# Patient Record
Sex: Male | Born: 1965 | Marital: Single | Smoking: Never smoker
Health system: Southern US, Community
[De-identification: ages and names within clinical notes are randomized; demographics above are authoritative.]

## PROBLEM LIST (undated history)

## (undated) DIAGNOSIS — H409 Unspecified glaucoma: Secondary | ICD-10-CM

## (undated) DIAGNOSIS — N390 Urinary tract infection, site not specified: Secondary | ICD-10-CM

## (undated) DIAGNOSIS — S069X9A Unspecified intracranial injury with loss of consciousness of unspecified duration, initial encounter: Secondary | ICD-10-CM

## (undated) DIAGNOSIS — R569 Unspecified convulsions: Secondary | ICD-10-CM

## (undated) DIAGNOSIS — S069XAA Unspecified intracranial injury with loss of consciousness status unknown, initial encounter: Secondary | ICD-10-CM

## (undated) HISTORY — DX: Unspecified intracranial injury with loss of consciousness of unspecified duration, initial encounter: S06.9X9A

## (undated) HISTORY — DX: Unspecified intracranial injury with loss of consciousness status unknown, initial encounter: S06.9XAA

## (undated) HISTORY — DX: Unspecified glaucoma: H40.9

---

## 2005-06-12 ENCOUNTER — Encounter: Admission: RE | Admit: 2005-06-12 | Discharge: 2005-08-22 | Payer: Self-pay | Admitting: Psychiatry

## 2009-05-02 ENCOUNTER — Emergency Department: Payer: Self-pay | Admitting: Emergency Medicine

## 2009-05-08 ENCOUNTER — Emergency Department: Payer: Self-pay | Admitting: Emergency Medicine

## 2010-01-12 ENCOUNTER — Encounter: Payer: Self-pay | Admitting: Psychiatry

## 2010-02-02 ENCOUNTER — Encounter: Payer: Self-pay | Admitting: Psychiatry

## 2010-09-13 IMAGING — CT CT HEAD WITHOUT CONTRAST
2 series · 16 of 30 positions shown, 20 images · non-contrast
Comparison: none

REASON FOR EXAM: pt with hx of ZEINAB, Mentally handicapped, fall -
lac/contusion to head. Eval for
COMMENTS:

PROCEDURE:     CT  - CT HEAD WITHOUT CONTRAST  - May 02, 2009  [DATE]
RESULT:     Comparison:  None
TECHNIQUE: Multiple axial images from the foramen magnum to the vertex were
obtained without IV contrast.

[Series 2: without · axial · non-contrast · 0.45mm/px · z∈[+519,+679]mm · 13 of 38 slices shown, 17 images]
[im 3/38  brain]
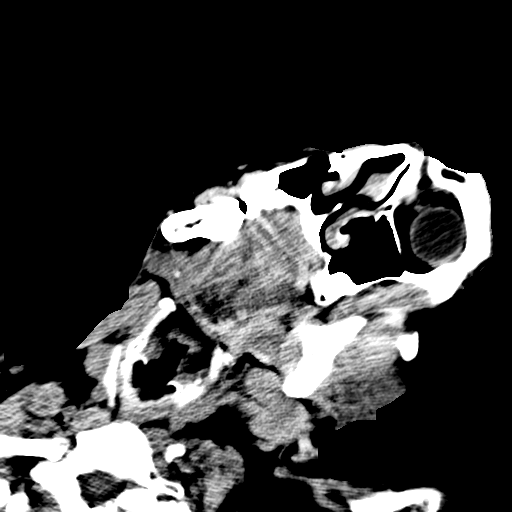
[im 3/38  bone]
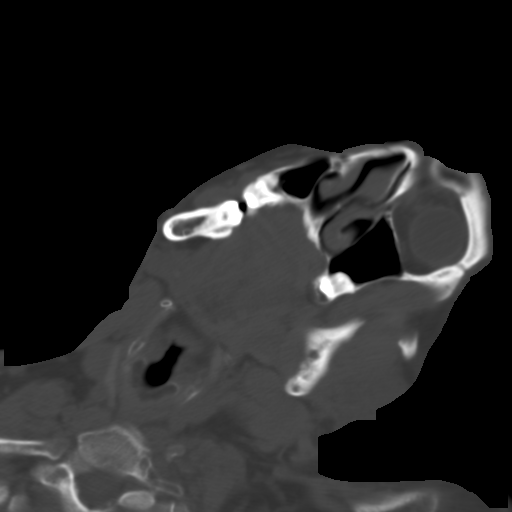
[im 6/38  brain]
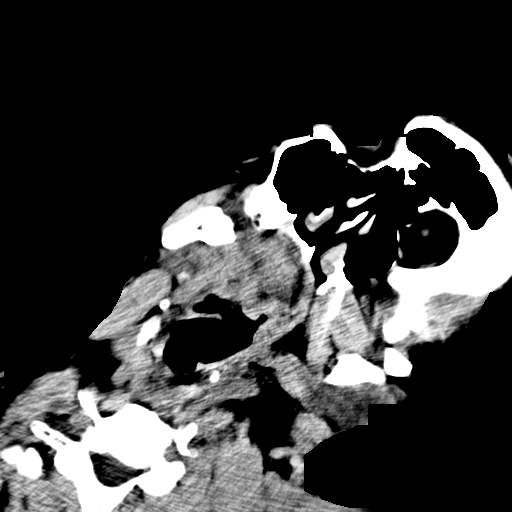
[im 8/38  brain]
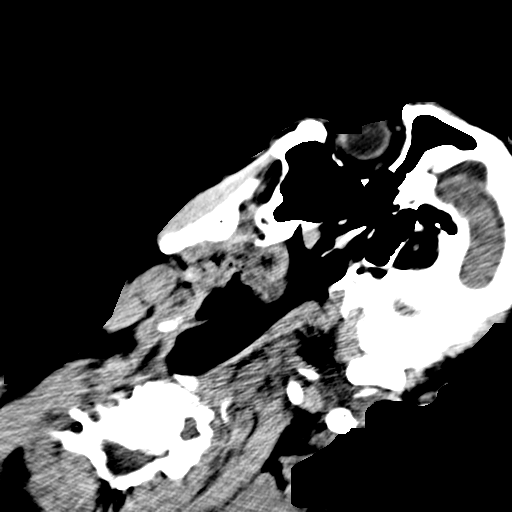
[im 11/38  brain]
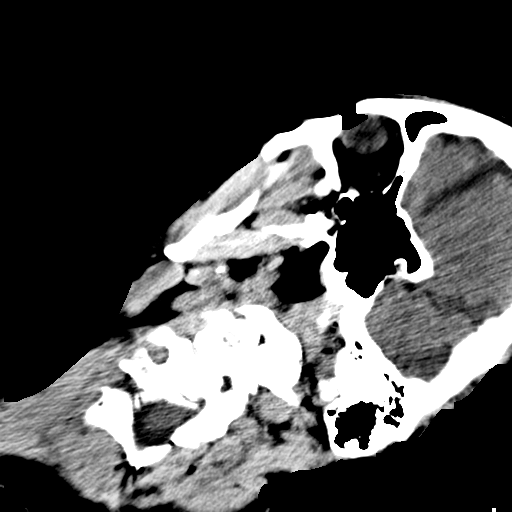
[im 14/38  brain]
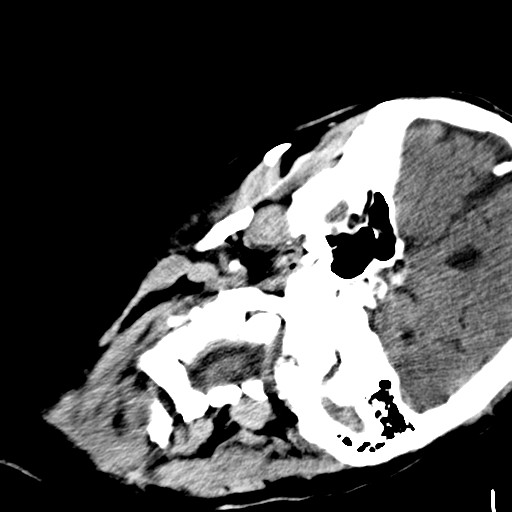
[im 14/38  bone]
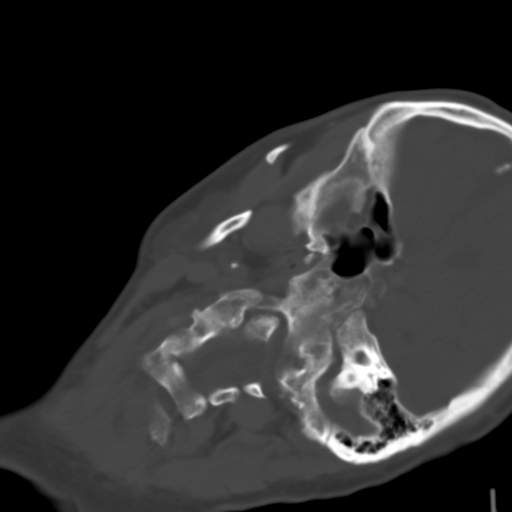
[im 16/38  brain]
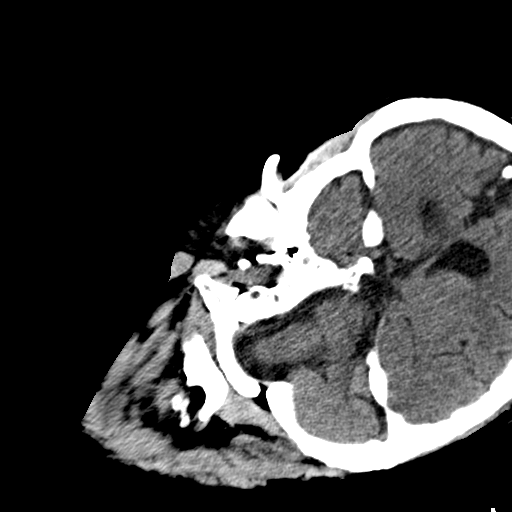
[im 19/38  brain]
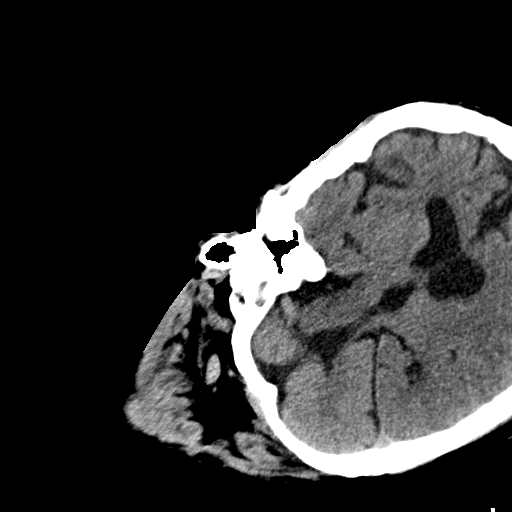
[im 22/38  brain]
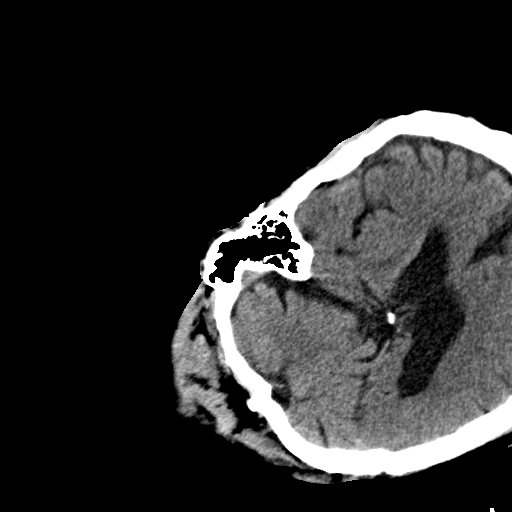
[im 24/38  brain]
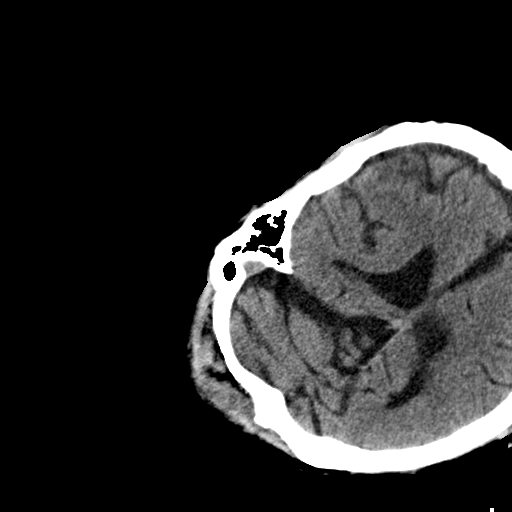
[im 24/38  bone]
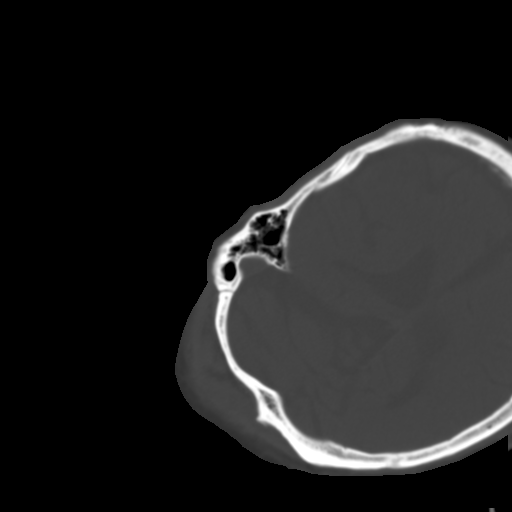
[im 27/38  brain]
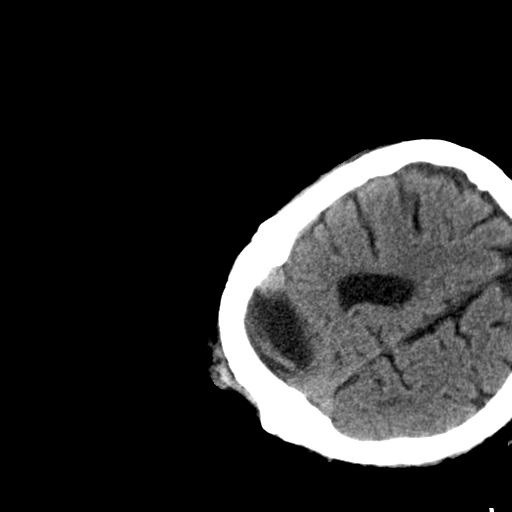
[im 30/38  brain]
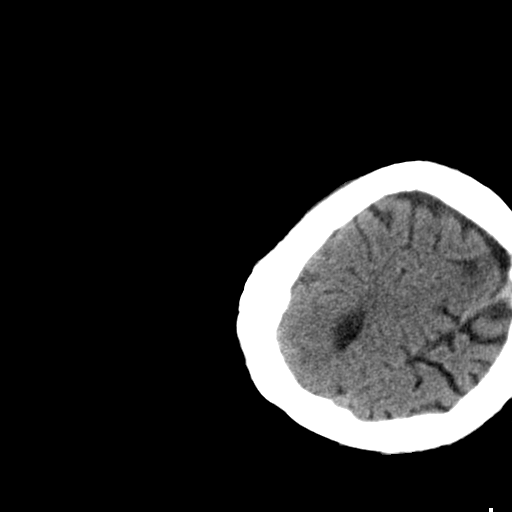
[im 32/38  brain]
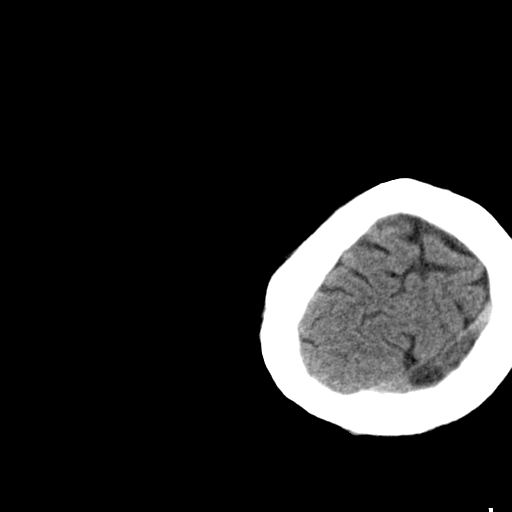
[im 35/38  brain]
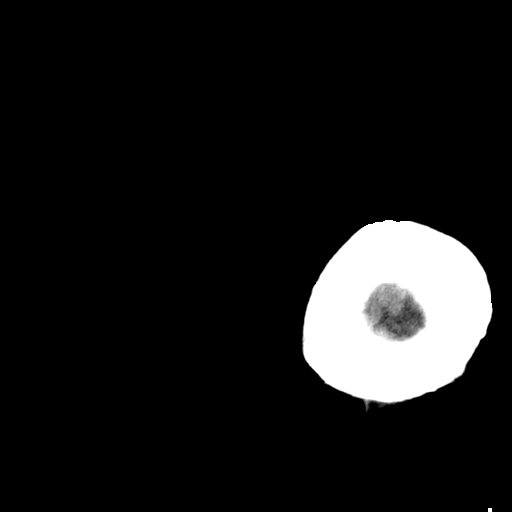
[im 35/38  bone]
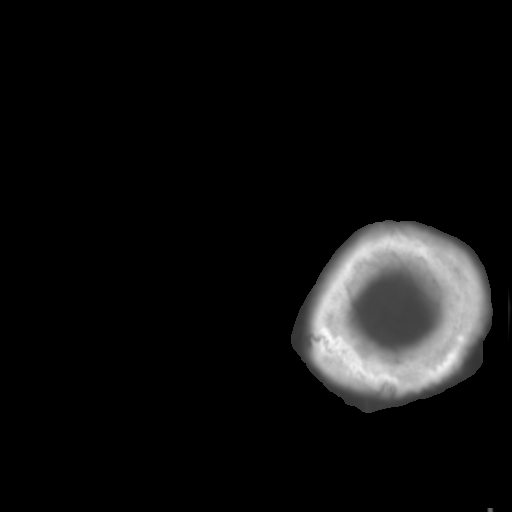

[Series 3: bone · axial · 0.45mm/px · z∈[+519,+574]mm · 3 of 38 slices shown]
[im 3/38  bone]
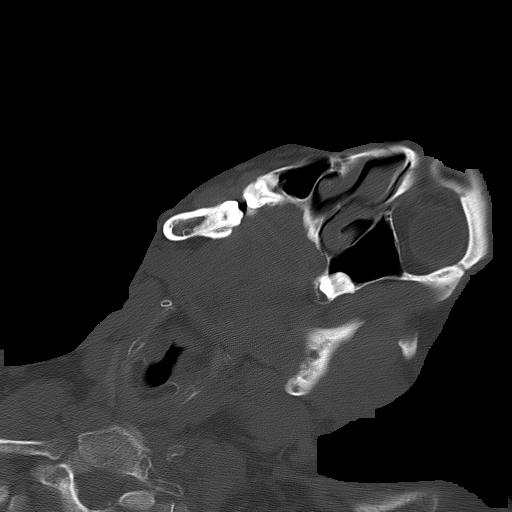
[im 8/38  bone]
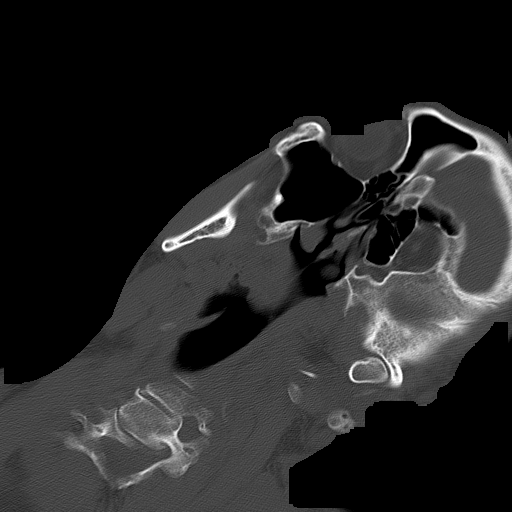
[im 14/38  bone]
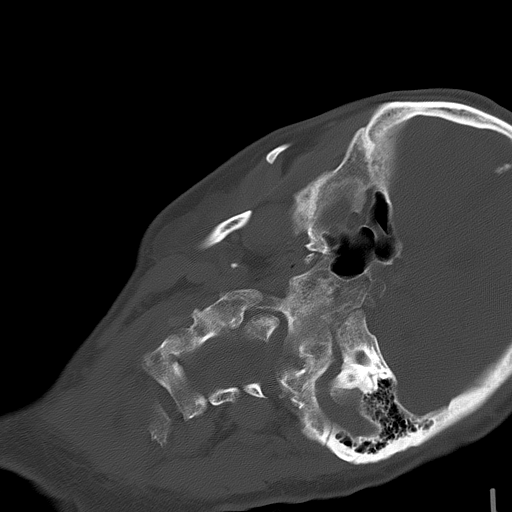

[16 of 30 positions shown; findings below may reference images not displayed]

FINDINGS: There is no evidence of mass effect, midline shift, or extra-axial fluid
collections.  There is no evidence of a space-occupying lesion or
intracranial hemorrhage. There is no evidence of a cortical-based area of
acute infarction. There is generalized cerebral atrophy.

The ventricles and sulci are appropriate for the patient's age. The basal
cisterns are patent.

Visualized portions of the orbits are unremarkable. The visualized portions
of the paranasal sinuses and mastoid air cells are unremarkable.

The osseous structures are unremarkable.
IMPRESSION: No acute intracranial process.

## 2013-11-19 ENCOUNTER — Encounter: Payer: Self-pay | Admitting: Neurology

## 2013-12-03 ENCOUNTER — Encounter: Payer: Self-pay | Admitting: Neurology

## 2014-01-03 ENCOUNTER — Encounter: Payer: Self-pay | Admitting: Neurology

## 2014-02-02 ENCOUNTER — Encounter: Payer: Self-pay | Admitting: Neurology

## 2014-05-27 ENCOUNTER — Encounter
Admit: 2014-05-27 | Disposition: A | Discharge status: Home / Self Care | Payer: Self-pay | Attending: Neurology | Admitting: Neurology

## 2014-06-04 ENCOUNTER — Encounter
Admit: 2014-06-04 | Disposition: A | Discharge status: Home / Self Care | Payer: Self-pay | Attending: Neurology | Admitting: Neurology

## 2014-07-08 ENCOUNTER — Ambulatory Visit: Payer: Medicare Other | Attending: Neurology | Admitting: Physical Therapy

## 2014-07-08 ENCOUNTER — Encounter: Payer: Self-pay | Admitting: Physical Therapy

## 2014-07-08 ENCOUNTER — Ambulatory Visit: Payer: Self-pay | Admitting: Physical Therapy

## 2014-07-08 DIAGNOSIS — M6281 Muscle weakness (generalized): Secondary | ICD-10-CM | POA: Insufficient documentation

## 2014-07-08 DIAGNOSIS — G243 Spasmodic torticollis: Secondary | ICD-10-CM | POA: Diagnosis present

## 2014-07-08 DIAGNOSIS — M2560 Stiffness of unspecified joint, not elsewhere classified: Secondary | ICD-10-CM | POA: Diagnosis not present

## 2014-07-08 DIAGNOSIS — R531 Weakness: Secondary | ICD-10-CM | POA: Diagnosis not present

## 2014-07-08 DIAGNOSIS — M5382 Other specified dorsopathies, cervical region: Secondary | ICD-10-CM | POA: Insufficient documentation

## 2014-07-08 DIAGNOSIS — M436 Torticollis: Secondary | ICD-10-CM

## 2014-07-08 NOTE — Therapy (Signed)
[facility] [address] [place], [place], [address] Phone: [phone]   Fax:  [phone]  Physical Therapy Treatment  Patient Details  Name: Jacob HammanSteven G Tangen MRN: [id number] Date of Birth: 08/22/1965 Referring Provider:  Morene CrockerPotter, Zachary E, MD  Encounter Date: 07/08/2014      PT End of Session - 07/08/14 1000    Visit Number 5   Number of Visits 10   Date for PT Re-Evaluation 07/22/14   Authorization Type 5   Authorization Time Period 10   PT Start Time 0900   PT Stop Time 0945   PT Time Calculation (min) 45 min   Activity Tolerance Patient limited by fatigue      Past Medical History  Diagnosis Date  . Closed TBI (traumatic brain injury) approximately 40 yrs ago    No past surgical history on file.  There were no vitals filed for this visit.  Visit Diagnosis:  Stiffness of cervical spine  Muscle weakness      Subjective Assessment - 07/08/14 0955    Subjective Pt and mother present for session. Pt's mother reports he has been unable to participate with stretching routine, has been "fighting" against stretching.   Patient is accompained by: Family member   Currently in Pain? Other (Comment)  unable to assess            [facility] PT Assessment - 07/08/14 0001    Assessment   Medical Diagnosis weakness, cervical dystonia   Balance Screen   Has the patient fallen in the past 6 months No   Has the patient had a decrease in activity level because of a fear of falling?  No   Is the patient reluctant to leave their home because of a fear of falling?  No         Objective:  Manual cervical stretching performed in seated, R lateral flexion, L rotation, performed separately and as a joint movement. Pt required extensive cuing from PT and mother to participate with manual therapy.   Extensive exercise attempted with pt's mother, PT and pt using candy as assist for improving posture, resisting manual  pressure for cervical motion, attempting to achieve neutral spinal posture. Pt had difficulty with this, required manual and verbal cuing.                     PT Education - 07/08/14 [phone]    Education provided Yes   Education Details educated pt's mother on expectations regarding OT visit   Person(s) Educated Parent(s);Patient   Methods Explanation             PT Long Term Goals - 07/08/14 1004    PT LONG TERM GOAL #1   Title Pt will be able to partcipate with cervical strengthening isometrics with less than 3 verbal cues to improve cervical spine strength   Time 8   Period Weeks   Status On-going   PT LONG TERM GOAL #2   Title Pt will improve cervical lateral flexion to 15 degrees R lateral flexion indicating improvement in motion to decr. strain on caregivers when assisting with feeding and bathing tasks.   Time 8   Period Weeks   Status On-going   PT LONG TERM GOAL #3   Title Pt will allow PT to perform scapular manual tx to decr. scapular IR and allow for lengthening of cervicothoracic musculature to improve respiration.   Time 8   Period Weeks  Status On-going               Plan - 07/08/14 1002    Clinical Impression Statement Pt demonstrated decr. tolerance for PT today, he has not been able to participate with his mother with his HEP. Noted incr. tone in B cervical musculature, significantly impaired posture. Pt is having incr. difficulty with ADLs/eating/participating in hygiene activity.   Pt will benefit from skilled therapeutic intervention in order to improve on the following deficits Improper body mechanics;Postural dysfunction;Decreased strength   Rehab Potential Fair   PT Frequency 2x / week   PT Duration 6 weeks   PT Treatment/Interventions Manual techniques;Wheelchair mobility training;Therapeutic activities;ADLs/Self Care Home Management   PT Next Visit Plan Reassess following OT assessment.   Consulted and Agree with Plan of Care  Patient;Family member/caregiver        Problem List There are no active problems to display for this patient.   Fisher,Benjamin PT, DPT 07/08/2014, 10:08 AM  [place] [facility] SPORTS MEDICINE [address] [place], [place], [address] Phone: [phone]   Fax:  [phone]

## 2014-07-14 ENCOUNTER — Ambulatory Visit: Payer: Medicare Other | Admitting: Physical Therapy

## 2014-07-14 DIAGNOSIS — M436 Torticollis: Secondary | ICD-10-CM

## 2014-07-14 DIAGNOSIS — R531 Weakness: Secondary | ICD-10-CM | POA: Diagnosis not present

## 2014-07-14 NOTE — Therapy (Signed)
[facility] [address] [place], [place], [address] Phone: [phone]   Fax:  [phone]  Physical Therapy Treatment  Patient Details  Name: Jacob Green MRN: [id number] Date of Birth: 01/12/1966 Referring Provider:  Morene CrockerPotter, Zachary E, MD  Encounter Date: 07/14/2014      PT End of Session - 07/14/14 0913    Visit Number 6   Number of Visits 10   Date for PT Re-Evaluation 07/22/14   Authorization Type 6   Authorization Time Period 10   PT Start Time 0910   PT Stop Time 0950   PT Time Calculation (min) 40 min   Activity Tolerance Patient tolerated treatment well      Past Medical History  Diagnosis Date  . Closed TBI (traumatic brain injury) approximately 40 yrs ago    No past surgical history on file.  There were no vitals filed for this visit.  Visit Diagnosis:  Stiffness of cervical spine      Subjective Assessment - 07/14/14 0911    Subjective Pt and mother present for session. She reports she has been inconsistent with helping pt with stretching and exercise, however she feels he has been doing better overall with upright posture.   Patient is accompained by: Family member             Extensive manual t stretching cervical spine, CPAs grade III L UPAs 3x1 min seated 3x1 min from T2-T6.  L cervical lateral flexion with closing mobilization performed x8 min.  Reaching for ball to facilitate trunk control/strengthen abs x5 min.  Cuing for pt to perform resisted cervical rotation.                    PT Education - 07/14/14 0913    Education Details Educated pt's mother on hand placement when stetching to avoid pain in wrist.   Person(s) Educated Patient;Parent(s)   Methods Explanation             PT Long Term Goals - 07/08/14 1004    PT LONG TERM GOAL #1   Title Pt will be able to partcipate with cervical strengthening isometrics with less than 3 verbal cues  to improve cervical spine strength   Time 8   Period Weeks   Status On-going   PT LONG TERM GOAL #2   Title Pt will improve cervical lateral flexion to 15 degrees R lateral flexion indicating improvement in motion to decr. strain on caregivers when assisting with feeding and bathing tasks.   Time 8   Period Weeks   Status On-going   PT LONG TERM GOAL #3   Title Pt will allow PT to perform scapular manual tx to decr. scapular IR and allow for lengthening of cervicothoracic musculature to improve respiration.   Time 8   Period Weeks   Status On-going               Plan - 07/14/14 [id number]    Clinical Impression Statement Significant improvement in upright posture, acceptance of verbal cues, and tolerance for both manual tx and participation with exercise today. Pt will not be seen next week but will have OT assessment, to be followed up the following week.   Pt will benefit from skilled therapeutic intervention in order to improve on the following deficits Improper body mechanics;Postural dysfunction;Decreased strength   Rehab Potential Fair   PT Treatment/Interventions Manual techniques;Wheelchair mobility training;Therapeutic activities;ADLs/Self Care Home Management   PT  Next Visit Plan Pt has not yet been seen by OT, will be placed on hold pending visit to OT.        Problem List There are no active problems to display for this patient.   Geno Sydnor 07/14/2014, 10:00 AM  [facility] [facility] PH[facility]ICAL [facility] SP[facility]TS [facility]ICINE [address] [place], [place], [address] Phone: [phone]   Fax:  [phone]

## 2014-07-15 ENCOUNTER — Encounter: Payer: Self-pay | Admitting: Physical Therapy

## 2014-07-19 ENCOUNTER — Encounter: Payer: Self-pay | Admitting: Occupational Therapy

## 2014-07-19 ENCOUNTER — Ambulatory Visit: Payer: Medicare Other | Admitting: Occupational Therapy

## 2014-07-19 DIAGNOSIS — R531 Weakness: Secondary | ICD-10-CM | POA: Diagnosis not present

## 2014-07-19 DIAGNOSIS — M6281 Muscle weakness (generalized): Secondary | ICD-10-CM
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
## 2014-07-21 ENCOUNTER — Encounter: Payer: Medicare Other | Admitting: Physical Therapy

## 2014-07-26 ENCOUNTER — Encounter: Payer: Self-pay | Admitting: Occupational Therapy

## 2014-07-26 ENCOUNTER — Encounter: Payer: Medicare Other | Admitting: Occupational Therapy

## 2014-07-26 ENCOUNTER — Ambulatory Visit: Payer: Medicare Other | Admitting: Occupational Therapy

## 2014-07-26 DIAGNOSIS — R531 Weakness: Secondary | ICD-10-CM | POA: Diagnosis not present

## 2014-07-26 DIAGNOSIS — M6281 Muscle weakness (generalized): Secondary | ICD-10-CM

## 2014-07-26 NOTE — Therapy (Signed)
[place] [facility] MAIN [facility] SERVICES [address] [place], [place], [address] Phone: [phone]   Fax:  [phone]  Occupational Therapy Treatment  Patient Details  Name: Jacob Green MRN: [id number] Date of Birth: [birth year] Referring Provider:  Morene Crocker, MD  Encounter Date: 07/26/2014      OT End of Session - 07/26/14 1022    Visit Number 2   Number of Visits 16   OT Start Time 0915   OT Stop Time 1010   OT Time Calculation (min) 55 min   Equipment Utilized During Treatment Adapted spoon and positioning for wheel chair   Activity Tolerance Patient tolerated treatment well;Treatment limited secondary to agitation      Past Medical History  Diagnosis Date  . Closed TBI (traumatic brain injury) approximately 40 yrs ago  . Glaucoma     No past surgical history on file.  There were no vitals filed for this visit.  Visit Diagnosis:  Muscle weakness      Subjective Assessment - 07/26/14 1017    Subjective  Patient and mom agreed they thought the curved spoon will work.   Patient is accompained by: Family member   Currently in Pain? No/denies    Practiced with a curved spoon curved toward his mouth using his more functional left hand and was successful in bringing food to his mouth. Cues needed for proper technique. Issued spoon to mom to try and given written instructions on how to obtain one if it works at home. Completed wheel chair adjustment and positioning to be more contusive to self feeding with out spillage or dripping from mouth.  Slow stretch given to right elbow and hand so it will not have further contractures and not be in the way for self feeding.                          OT Education - 07/26/14 1019    Education provided Yes   Education Details Instructed in wheel chair positioning to be more condusive to self feeding. Also alternative spoon which is curved tward his mouth. Issued spoon to try  and information regarding how to purchase one.   Person(s) Educated Patient;Parent(s)   Methods Explanation;Demonstration   Comprehension Verbalized understanding;Returned demonstration;Verbal cues required;Tactile cues required;Need further instruction          OT Short Term Goals - 07/19/14 0951    OT SHORT TERM GOAL #1   Title Will be able to eat with minimal spillage using adapted devices.           OT Long Term Goals - 07/19/14 1111    OT LONG TERM GOAL #1   Title Will be able to eat with minimal spillage using adapted devices.   Baseline Recently started dropping food and food falling from his mouth and forming a sore on left lateral lip.   Time 12   Period Weeks   Status New   OT LONG TERM GOAL #2   Title Will be able to sit up straight with head erect during self feeding.   Baseline leans severly to the left at neck and moderately in trunk.   Time 12   Period Weeks   Status New               Plan - 07/26/14 1030    Pt will benefit from skilled therapeutic intervention in order to improve on the following deficits (Retired) Decreased balance;Decreased  cognition;Decreased coordination;Decreased endurance;Decreased mobility;Decreased range of motion;Increased muscle spasms;Decreased strength;Impaired tone;Impaired UE functional use;Impaired vision/preception   Rehab Potential Good   OT Frequency 2x / week   OT Duration 12 weeks   OT Treatment/Interventions Visual/perceptual remediation/compensation;Neuromuscular education;Self-care/ADL training;Other (comment)   Plan Continuing adapted devices and positioning and stretching.   Consulted and Agree with Plan of Care Patient;Other (Comment)  mother        Problem List There are no active problems to display for this patient.  Ocie CornfieldJohn M Malaiyah Achorn, MS/OTR/L Ocie CornfieldHuff, Jden Want M 07/26/2014, 10:43 AM  [place] [facility] [address] [place], [place], [address] Phone:  [phone]   Fax:  [phone]

## 2014-07-28 ENCOUNTER — Ambulatory Visit: Payer: Medicare Other | Admitting: Physical Therapy

## 2014-07-28 DIAGNOSIS — M436 Torticollis: Secondary | ICD-10-CM

## 2014-07-28 DIAGNOSIS — R531 Weakness: Secondary | ICD-10-CM | POA: Diagnosis not present

## 2014-07-28 NOTE — Therapy (Signed)
[facility] PHYSICAL [facility] SPORTS MEDICINE [address] [place], [place], [address] Phone: [phone]   Fax:  [phone]  Physical Therapy Treatment  Patient Details  Name: Jacob Green MRN: [id number] Date of Birth: 11/24/1965 Referring Provider:  Morene CrockerPotter, Zachary E, MD  Encounter Date: 07/28/2014      PT End of Session - 07/28/14 0925    Visit Number 7   Number of Visits 10   Date for PT Re-Evaluation 07/22/14   Authorization Type 7   Authorization Time Period 10   PT Start Time 0900   PT Stop Time 0940   PT Time Calculation (min) 40 min   Activity Tolerance Patient tolerated treatment well   Behavior During Therapy Restless      Past Medical History  Diagnosis Date  . Closed TBI (traumatic brain injury) approximately 40 yrs ago  . Glaucoma     No past surgical history on file.  There were no vitals filed for this visit.  Visit Diagnosis:  Stiffness of cervical spine      Subjective Assessment - 07/28/14 0907    Subjective Pt and mother present for session. She reports he has had two instances of seeing an OT with little to no effect on eating difficulty. Reports he has had notable drop in tolerance for stretching.   Patient is accompained by: Family member   Currently in Pain? No/denies               Objective: Seated scapular mobs inferior, posterior tilt, medial 3x30.  CPAs grade III 3x1 min C7-T4.  L UPAs grade III 31 min same regions.  Sustained cervical lateral flexion stretch 3x4 min.  Pt became agitated during session with sustained lateral flexion stretch once he achieved 15 degr. Had difficulty tolerating additional PT despite extensive cuing from PT and from mother.  Added towel to R side w/c to improve upright posture.  Following session pt had  Notable incr. In upright posture, ear no longer resting on shoulder when exiting session.                       PT Long Term Goals -  07/28/14 0933    PT LONG TERM GOAL #1   Title Pt will be able to partcipate with cervical strengthening isometrics with less than 3 verbal cues to improve cervical spine strength   Time 8   Period Weeks   Status On-going   PT LONG TERM GOAL #2   Title Pt will improve cervical lateral flexion to 15 degrees R lateral flexion indicating improvement in motion to decr. strain on caregivers when assisting with feeding and bathing tasks.   Time 8   Status Achieved   PT LONG TERM GOAL #3   Title Pt will allow PT to perform scapular manual tx to decr. scapular IR and allow for lengthening of cervicothoracic musculature to improve respiration.   Time 8   Period Weeks   Status On-going   PT LONG TERM GOAL #4   Title Pt's mother will report no difficulty with shaving pt due to impaired posture.   Time 8   Period Weeks   Status New               Problem List There are no active problems to display for this patient.   [name] 07/28/2014, 9:47 AM  [place] [facility] SP[facility] MEDICINE [address] [place], [place],  [place] Phone: [phone]   Fax:  [phone]

## 2014-08-03 ENCOUNTER — Ambulatory Visit: Payer: Medicare Other | Admitting: Occupational Therapy

## 2014-08-03 DIAGNOSIS — M436 Torticollis: Secondary | ICD-10-CM

## 2014-08-03 DIAGNOSIS — R531 Weakness: Secondary | ICD-10-CM | POA: Diagnosis not present

## 2014-08-03 DIAGNOSIS — M6281 Muscle weakness (generalized): Secondary | ICD-10-CM

## 2014-08-03 NOTE — Therapy (Signed)
[place] [facility] [facility] [address] [place], [place], [address] Phone: [phone]   Fax:  [phone]  Occupational Therapy Treatment  Patient Details  Name: Lavina HammanSteven G Heffernan MRN: [id number] Date of Birth: 05/25/1965 Referring Provider:  Morene CrockerPotter, Zachary E, MD  Encounter Date: 08/03/2014    Past Medical History  Diagnosis Date  . Closed TBI (traumatic brain injury) approximately 40 yrs ago  . Glaucoma     No past surgical history on file.  There were no vitals filed for this visit.  Visit Diagnosis:  Muscle weakness  Stiffness of cervical spine Experimented with different plates and adapted spoons. Sent home with a lipped plate and different handle to spoon as other did not work. He will try this week and given written information to order if it works. Stretching completed to right upper extremity to aid in positioning.                              OT Short Term Goals - 07/19/14 0951    OT SHORT TERM GOAL #1   Title Will be able to eat with minimal spillage using adapted devices.           OT Long Term Goals - 07/19/14 1111    OT LONG TERM GOAL #1   Title Will be able to eat with minimal spillage using adapted devices.   Baseline Recently started dropping food and food falling from his mouth and forming a sore on left lateral lip.   Time 12   Period Weeks   Status New   OT LONG TERM GOAL #2   Title Will be able to sit up straight with head erect during self feeding.   Baseline leans severly to the left at neck and moderately in trunk.   Time 12   Period Weeks   Status New               Problem List There are no active problems to display for this patient. Ocie CornfieldJohn M Harleigh Civello, MS/OTR/L  Ocie CornfieldHuff, Egbert Seidel M 08/03/2014, 9:50 AM  [facility] [address] [place], [place], [address] Phone: [phone]   Fax:  [phone]

## 2014-08-05 ENCOUNTER — Encounter: Payer: Medicare Other | Admitting: Physical Therapy

## 2014-08-09 ENCOUNTER — Encounter: Payer: Medicare Other | Admitting: Occupational Therapy

## 2014-08-11 ENCOUNTER — Ambulatory Visit: Payer: Medicare Other | Attending: Neurology | Admitting: Physical Therapy

## 2014-08-11 DIAGNOSIS — M436 Torticollis: Secondary | ICD-10-CM | POA: Insufficient documentation

## 2014-08-11 DIAGNOSIS — M6281 Muscle weakness (generalized): Secondary | ICD-10-CM | POA: Diagnosis present

## 2014-08-11 NOTE — Therapy (Signed)
[facility] [address], [place], [address] Phone: [phone]   Fax:  [phone]  Physical Therapy Treatment  Patient Details  Name: Jacob Green MRN: [id number] Date of Birth: [birth year] Referring Provider:  Morene Crocker, MD  Encounter Date: 08/11/2014      PT End of Session - 08/11/14 0944    Visit Number 8   Number of Visits 10   Date for PT Re-Evaluation 09/03/14   Authorization Type 8   Authorization Time Period 10   PT Start Time 0900   PT Stop Time 0940   PT Time Calculation (min) 40 min   Activity Tolerance Patient tolerated treatment well      Past Medical History  Diagnosis Date  . Closed TBI (traumatic brain injury) approximately 40 yrs ago  . Glaucoma     No past surgical history on file.  There were no vitals filed for this visit.  Visit Diagnosis:  Stiffness of cervical spine  Muscle weakness      Subjective Assessment - 08/11/14 0911    Subjective Pt and mother present for session. She reports they have d/c OT as they did not feel it was effective. Pt's mother also reports that her son will be seen by an additional PT in the context of school.    Patient is accompained by: Family member   Currently in Pain? No/denies             Objective: Verbal cuing for upright posture, with tactile cuing and manual assistance for both trunk and cervical spine. Pt's mother assisted with this, with extensive time and encouragement pt able to achieve wt through ischia and -15 degrees from neutral cervical posture.  Seated cervical stretching for improved lateral flexion and rotation. During this noted incr. Tone in SCM. Attempted to address this but unable to do so due to pt discomfort lack of tolerance.  Educated pt's mother on strategies for maintaining definite improvement in cervical posture using candy to encourage R rotation in more neutral posture.  Pt's mother in  session reprorts that if he is able to maintain this posture she will be able to better clean him following eating, currently pt has a small area of redness around L side of mouth from food not being cleaned out due to cervical positioning. PT encouraged pt's mother to assess how this is over the next few days.                    PT Education - 08/11/14 0913    Education provided Yes   Education Details educated pt's mother on working with two PTs simultaneously.   Person(s) Educated Parent(s);Patient   Methods Explanation;Demonstration   Comprehension Verbalized understanding;Returned demonstration             PT Long Term Goals - 07/28/14 0933    PT LONG TERM GOAL #1   Title Pt will be able to partcipate with cervical strengthening isometrics with less than 3 verbal cues to improve cervical spine strength   Time 8   Period Weeks   Status On-going   PT LONG TERM GOAL #2   Title Pt will improve cervical lateral flexion to 15 degrees R lateral flexion indicating improvement in motion to decr. strain on caregivers when assisting with feeding and bathing tasks.   Time 8   Status Achieved   PT LONG TERM GOAL #3   Title Pt will allow PT  to perform scapular manual tx to decr. scapular IR and allow for lengthening of cervicothoracic musculature to improve respiration.   Time 8   Period Weeks   Status On-going   PT LONG TERM GOAL #4   Title Pt's mother will report no difficulty with shaving pt due to impaired posture.   Time 8   Period Weeks   Status New               Plan - 08/11/14 0944    Clinical Impression Statement Pt able to achieve -15 degrees from neutral cervical position with verbal cuing. Self selected cervical posture improved to keeping ear off shoulder at start and end of session with no cuing. Noted today significant tone limiting posture in SCM muscle, unable to address tdue to positioning but wlil address in supine at next session.   Pt will  benefit from skilled therapeutic intervention in order to improve on the following deficits Improper body mechanics;Postural dysfunction;Decreased strength   Rehab Potential Fair   PT Frequency 2x / week   PT Duration 6 weeks        Problem List There are no active problems to display for this patient.   Fisher,Benjamin 08/11/2014, 9:49 AM  [facility]IC[facility] [address] [place], [place], [address] Phone: [phone]   Fax:  [phone]

## 2014-08-18 NOTE — Addendum Note (Signed)
Addended by: Dorette Grate on: 08/18/2014 07:47 AM   Modules accepted: Orders

## 2014-08-25 ENCOUNTER — Ambulatory Visit: Payer: Medicare Other | Admitting: Physical Therapy

## 2014-08-25 DIAGNOSIS — M436 Torticollis: Secondary | ICD-10-CM

## 2014-08-25 NOTE — Therapy (Signed)
[facility] PH[facility]TS [facility]INE [address] [place], [address] Phone: [phone]   Fax:  [phone]  Physical Therapy Treatment  Patient Details  Name: Jacob Green MRN: [id number] Date of Birth: [birth year] Referring Provider:  Morene Crocker, MD  Encounter Date: 08/25/2014      PT End of Session - 08/25/14 0943    Visit Number 9   Number of Visits 10   Authorization Type 9   Authorization Time Period 10   PT Start Time 0900   PT Stop Time 0943   PT Time Calculation (min) 43 min   Activity Tolerance Patient tolerated treatment well      Past Medical History  Diagnosis Date  . Closed TBI (traumatic brain injury) approximately 40 yrs ago  . Glaucoma     No past surgical history on file.  There were no vitals filed for this visit.  Visit Diagnosis:  Stiffness of cervical spine      Subjective Assessment - 08/25/14 0907    Subjective Pt and mother present for session. She reports he has been very stiff and difficult to work with over the past few days to a week.    Patient is accompained by: Family member   Currently in Pain? No/denies             Objective: Cuing for pt to perform active cervical rotation and neck flexion, extensive verbal and manual cuing, in addition to this pt also performed resisted maintaining cervical positioning against PT resistance. Signfiicant improvement in ability to perform these activities following manual tx.  MWM for cervical lateral flexion x5 min.  SCM pulse release with cervical spine in lateral flexion on L to produce incr. Tension/improve effect of manual release. Performed for total of 8 min, reassessed and noted significant decr. In resistance to manual placement into neutral spinal posture.  Educated pt's mother on hand placement for performance of these new manual techniques. Pt did not allow mother to do this possibly due to incr. Antalgia with attempted  performance. Continued with this cuing but pt did not tolerate at this time, will reassess at next session.                         PT Long Term Goals - 07/28/14 0933    PT LONG TERM GOAL #1   Title Pt will be able to partcipate with cervical strengthening isometrics with less than 3 verbal cues to improve cervical spine strength   Time 8   Period Weeks   Status On-going   PT LONG TERM GOAL #2   Title Pt will improve cervical lateral flexion to 15 degrees R lateral flexion indicating improvement in motion to decr. strain on caregivers when assisting with feeding and bathing tasks.   Time 8   Status Achieved   PT LONG TERM GOAL #3   Title Pt will allow PT to perform scapular manual tx to decr. scapular IR and allow for lengthening of cervicothoracic musculature to improve respiration.   Time 8   Period Weeks   Status On-going   PT LONG TERM GOAL #4   Title Pt's mother will report no difficulty with shaving pt due to impaired posture.   Time 8   Period Weeks   Status New               Plan - 08/25/14 6063    Clinical Impression Statement At start  of session pt demonstrated very poor posture, had noted incr. redness but no sign of actual wound in L cervical crease. Unable to achieve neutral spine or maintain upright posture with manual cuing. Following extensive SCM release pt able to hold upright posture for 2x2 min with cuing. Pt may be approrpiate for dropping down to maintenance for continued PT.         Problem List There are no active problems to display for this patient.   Nox Talent 08/25/2014, 9:48 AM  [facility] PHYSICAL AND SPORTS MEDICINE [address] [place], [address] Phone: [phone]   Fax:  [phone]

## 2014-09-08 ENCOUNTER — Encounter: Payer: Medicare Other | Admitting: Physical Therapy

## 2014-09-15 ENCOUNTER — Ambulatory Visit: Payer: Medicare Other | Attending: Neurology | Admitting: Physical Therapy

## 2014-09-15 DIAGNOSIS — M436 Torticollis: Secondary | ICD-10-CM | POA: Insufficient documentation

## 2014-09-15 NOTE — Therapy (Signed)
[place] [facility]ICAL AND SPORTS MEDICINE [address] [place], [address] Phone: [phone]   Fax:  [phone]  Physical Therapy Treatment  Patient Details  Name: Jacob Green MRN: [id number] Date of Birth: 09-14-1965 Referring Provider:  Anabel Bene, MD  Encounter Date: 09/15/2014      PT End of Session - 09/15/14 0938    Visit Number 10   Number of Visits 10   PT Start Time 0900   PT Stop Time 0938   PT Time Calculation (min) 38 min   Activity Tolerance Patient tolerated treatment well      Past Medical History  Diagnosis Date  . Closed TBI (traumatic brain injury) approximately 40 yrs ago  . Glaucoma     No past surgical history on file.  There were no vitals filed for this visit.  Visit Diagnosis:  Stiffness of cervical spine - Plan: PT plan of care cert/re-cert      Subjective Assessment - 09/15/14 0906    Subjective Pt and mother present for session. She is pleased with his progress, she has been performing soft tissue work on him which has been helpful in maintaining more upright posture.   Patient is accompained by: Family member   Currently in Pain? No/denies                  Objective: Manual scapular mobs 3x1 min depression, retraction grade IV. STM performed on UT, LS. Instructed pt's mother in performance of this followed by stretching to improve overall motion. Pt's mother had difficulty tolerating this due to hand pain so focused on instruction on using devices which allow for a neutral wrist posture. With this pt's mother able to perform meaningful intervention with minimal c/o pain.  PT educated pt and mother on maintianing ROM improvements following d/c from PT, pt's mother verbalized understanding of this.               PT Education - 09/15/14 [id number]    Education provided Yes   Education Details PT educated pt's mother on using massager on pts soft tissue to minimize pain  in hands with performance of home program   Person(s) Educated Parent(s)   Methods Explanation;Demonstration   Comprehension Verbalized understanding             PT Long Term Goals - 09/15/14 1025    PT LONG TERM GOAL #1   Title Pt will be able to partcipate with cervical strengthening isometrics with less than 3 verbal cues to improve cervical spine strength   Time 8   Period Weeks   Status Not Met   PT LONG TERM GOAL #2   Title Pt will improve cervical lateral flexion to 15 degrees R lateral flexion indicating improvement in motion to decr. strain on caregivers when assisting with feeding and bathing tasks.   Time 8   Period Weeks   Status Achieved   PT LONG TERM GOAL #3   Title Pt will allow PT to perform scapular manual tx to decr. scapular IR and allow for lengthening of cervicothoracic musculature to improve respiration.   Time 8   Period Weeks   Status Achieved               Plan - 09/15/14 1023    Clinical Impression Statement Pt is appropriate for d/c at this time. Pt's mother has been instructed in measures to maintain ROM and has made minimal progress in terms of self  selected posture in the past two sessions. Pt may benefit from additional PT further down the road if stiffness/ROM increases.          G-Codes - 2014-10-08 1025    Functional Assessment Tool Used posture   Functional Limitation Changing and maintaining body position   Changing and Maintaining Body Position Discharge Status [phone]) At least 20 percent but less than 40 percent impaired, limited or restricted      Problem List There are no active problems to display for this patient.   Fisher,Benjamin 10-08-2014, 10:28 AM  [place] PHYSICAL AND SPORTS MEDICINE [address], [place], [address] Phone: [phone]   Fax:  [phone]

## 2014-09-22 ENCOUNTER — Ambulatory Visit: Payer: Medicare Other | Admitting: Physical Therapy

## 2014-12-13 ENCOUNTER — Ambulatory Visit: Payer: Medicare Other | Attending: Neurology | Admitting: Physical Therapy

## 2014-12-13 DIAGNOSIS — M436 Torticollis: Secondary | ICD-10-CM | POA: Diagnosis not present

## 2014-12-13 NOTE — Therapy (Signed)
[facility] PHYS[facility] SPORTS MEDICINE [address], [place], [address] Phone: [phone]   Fax:  [phone]  Physical Therapy Evaluation  Patient Details  Name: Jacob Green MRN: [id number] Date of Birth: 02-21-1966 Referring Provider:  Morene Crocker, MD  Encounter Date: 12/13/2014      PT End of Session - 12/13/14 0901    Visit Number 1   Number of Visits 13   Date for PT Re-Evaluation 02/26/15   Authorization Type Gcode   Authorization - Visit Number 1   Authorization - Number of Visits 10   PT Start Time 0845   PT Stop Time 0930   PT Time Calculation (min) 45 min   Activity Tolerance Patient tolerated treatment well   Behavior During Therapy Restless      Past Medical History  Diagnosis Date  . Closed TBI (traumatic brain injury) approximately 40 yrs ago  . Glaucoma     No past surgical history on file.  There were no vitals filed for this visit.  Visit Diagnosis:  Stiffness of cervical spine - Plan: PT plan of care cert/re-cert      Subjective Assessment - 12/13/14 0845    Subjective Pt has extensive history of cervical and R shoulder ROM limitations which are related to TBI which pt had in the 1970's. Currently pt has been losing ROM in neck which has negatively impacted feeding.    Patient is accompained by: Family member   Pertinent History See subjective   Patient Stated Goals Mother would like to be able to feed pt, wash pt, and shaving/brushing teeth.   Currently in Pain? No/denies            [facility] PT Assessment - 12/13/14 0001    Assessment   Medical Diagnosis Laterocollis   Prior Therapy yes   Precautions   Precautions None   Balance Screen   Has the patient fallen in the past 6 months No   Has the patient had a decrease in activity level because of a fear of falling?  No   Is the patient reluctant to leave their home because of a fear of falling?  No   Prior Function   Level of  Independence Needs assistance with ADLs;Needs assistance with homemaking;Needs assistance with transfers   Vocation Requirements Pt requires assistance for all ADLs, pt is in w/c and is unable to ambulate in public, Pts mother reports he is able to use w/c in house   ROM / Strength   AROM / PROM / Strength AROM;PROM   AROM   Overall AROM Comments Unable to measure due to difficulty with following commands, however pt is able to achieve -20 degr. lateral flexion. able to achieve upper cervical extension, limited lower cervical extension. R elbow able to achieve -40 degr. to full ext.    PROM   Overall PROM Comments Neutral cervical position in terms of lateral flexion, unable to achieve neutral cervical sagittal position. Pt resisted elbow extension so did not fully assess.   Palpation   Spinal mobility Hypomobility with CPAs and UPAs C5-T3.        Objective: Extensive passive stretching performed with pt seated in w/c for lateral flexion R, B rotation, lower cervical extension.  Trigger point release performed on L SCM.  Elbow extension passive stretch to reduce tone.  Manual tapping for improving upright resting posture, pt able to achieve hold for up to 20 sec, usually when pt broke posture  this occurred with use of L UE which appears to be overflowing into R rotation and L lateral flexion for cervical spine.  Pt responds best with extended stretches with significant amount of verbal cuing and prep work, requires periods of acculturation to tolerate new directions or new stretches.  Following session pt demonstrated no change in resting posture, though when cued for volitional "sit more upright" Pt achieved a more neutral position.                   PT Education - 2015/01/12 0859    Education provided Yes   Education Details Educated pt's mother on maintenance PT.   Person(s) Educated Patient;Parent(s)   Methods Explanation   Comprehension Verbalized understanding              PT Long Term Goals - 2015-01-12 0937    PT LONG TERM GOAL #1   Title Pt will demonstrate improved upright posture in cervical spine when eating based on Pt's mother's report as well as decr. redness on L side of mouth.   Time 12   Period Weeks   Status New   PT LONG TERM GOAL #2   Title Pt will improve cervical lateral flexion to 15 degrees R lateral flexion indicating improvement in motion to decr. strain on caregivers when assisting with feeding and bathing tasks.   Baseline Pt has lost all motion beyond midline.   Time 12   Period Weeks   Status New               Plan - 12-Jan-2015 1610    Clinical Impression Statement Pt is a pleasant 49 y/o male with long term TBI with cervical and elbow dysfunctions related to TBI. Cervical spine in L lateral flexion which is limited. Pt has had multiple bouts of PT to address this, and they help but when pt is discharged pt begins to lose improvements in functional mobility. Pt's mother is unable to perform appropriate stretches due to hand pain so pt would benefit from PT to maintain ROM, improve strength and facilitate participation in feeding, washing, and bathing activities.    Pt will benefit from skilled therapeutic intervention in order to improve on the following deficits Decreased range of motion;Impaired perceived functional ability;Improper body mechanics;Decreased strength;Decreased mobility   Rehab Potential Fair   Clinical Impairments Affecting Rehab Potential TBI, family support   PT Frequency 1x / week   PT Duration 12 weeks   PT Treatment/Interventions Manual techniques;Wheelchair mobility training;Therapeutic activities;ADLs/Self Care Home Management;Functional mobility training   Consulted and Agree with Plan of Care Patient;Family member/caregiver          G-Codes - 01-12-2015 0941    Functional Assessment Tool Used Posture, ADL participation (feeding)   Functional Limitation Changing and maintaining body  position   Changing and Maintaining Body Position Current Status [phone]) At least 40 percent but less than 60 percent impaired, limited or restricted   Changing and Maintaining Body Position Goal Status ([id number]) At least 1 percent but less than 20 percent impaired, limited or restricted       Problem List There are no active problems to display for this patient.   Fisher,Benjamin PT DPT Jan 12, 2015, 9:44 AM  [facility] PHYSICAL AND SPORTS MEDICINE [address] [place], [address] Phone: [phone]   Fax:  [phone]

## 2014-12-27 ENCOUNTER — Ambulatory Visit: Payer: Medicare Other | Admitting: Physical Therapy

## 2014-12-27 DIAGNOSIS — M436 Torticollis: Secondary | ICD-10-CM | POA: Diagnosis not present

## 2014-12-27 NOTE — Therapy (Signed)
[facility] [address] [place], [place], [address] Phone: [phone]   Fax:  [phone]  Physical Therapy Treatment  Patient Details  Name: Jacob Green MRN: [id number] Date of Birth: 02/25/1966 No Data Recorded  Encounter Date: 12/27/2014      PT End of Session - 12/27/14 0839    Visit Number 2   Number of Visits 13   Date for PT Re-Evaluation 02/26/15   Authorization Type Gcode   Authorization - Visit Number 2   Authorization - Number of Visits 10   PT Start Time 0830   PT Stop Time 0908   PT Time Calculation (min) 38 min   Activity Tolerance Patient tolerated treatment well   Behavior During Therapy Restless      Past Medical History  Diagnosis Date  . Closed TBI (traumatic brain injury) approximately 40 yrs ago  . Glaucoma     No past surgical history on file.  There were no vitals filed for this visit.  Visit Diagnosis:  Stiffness of cervical spine      Subjective Assessment - 12/27/14 0838    Subjective Pt and mother present for session. Reports he had one day of improvement in posture following previous session.   Patient is accompained by: Family member   Pertinent History See subjective   Patient Stated Goals Mother would like to be able to feed pt, wash pt, and shaving/brushing teeth.   Currently in Pain? No/denies         Objective: All stretching performed in seated.  Sustained lateral flexion stretch with one hand under skull, one on shoulder, extensive verbal cuing and light oscillations performed to facilitate relaxation. 20 min total.  Elbow extension sustained stretching with wrist flexion and with no flexion, 5 min total.  Manually assisted shoulder flexion on R to activate peri-cervical musculature. Same performed for shulder abduction.  Manually assisted (via resistance) cervical rotation x5 min.  Pt able to achieve 15 degr. R lateral flexion following session,  at start of session unable to achieve neutral.                             PT Long Term Goals - 12/13/14 0937    PT LONG TERM GOAL #1   Title Pt will demonstrate improved upright posture in cervical spine when eating based on Pt's mother's report as well as decr. redness on L side of mouth.   Time 12   Period Weeks   Status New   PT LONG TERM GOAL #2   Title Pt will improve cervical lateral flexion to 15 degrees R lateral flexion indicating improvement in motion to decr. strain on caregivers when assisting with feeding and bathing tasks.   Baseline Pt has lost all motion beyond midline.   Time 12   Period Weeks   Status New               Plan - 12/27/14 0859    Clinical Impression Statement Pt's mother reports he had consistent improvement in posture with toileting for one day following eval. Focus of session on improving ability of mother to position pt head in seated for feeding including stretching and some manual therapy.   Pt will benefit from skilled therapeutic intervention in order to improve on the following deficits Decreased range of motion;Impaired perceived functional ability;Improper body mechanics;Decreased strength;Decreased mobility   Rehab Potential Fair   Clinical  Impairments Affecting Rehab Potential TBI, family support   PT Frequency 1x / week   PT Duration 12 weeks   PT Treatment/Interventions Manual techniques;Wheelchair mobility training;Therapeutic activities;ADLs/Self Care Home Management;Functional mobility training   Consulted and Agree with Plan of Care Patient;Family member/caregiver        Problem List There are no active problems to display for this patient.   Fisher,Jacob Green 12/27/2014, 9:07 AM  [facility] PHYSICAL AND SPORTS MEDICINE [address], [place], [address] Phone: [phone]   Fax:  [phone]  Name: Jacob Green MRN: [id number] Date of Birth:  1965-12-06

## 2015-01-12 ENCOUNTER — Ambulatory Visit: Payer: Medicare Other | Attending: Neurology | Admitting: Physical Therapy

## 2015-01-12 DIAGNOSIS — M436 Torticollis: Secondary | ICD-10-CM

## 2015-01-12 NOTE — Therapy (Signed)
[facility] [address] [place], [place], [address] Phone: [phone]   Fax:  [phone]  Physical Therapy Treatment  Patient Details  Name: Jacob Green MRN: [id number] Date of Birth: 09/14/1965 No Data Recorded  Encounter Date: 01/12/2015      PT End of Session - 01/12/15 0839    Visit Number 3   Number of Visits 13   Date for PT Re-Evaluation 02/26/15   Authorization Type Gcode   Authorization - Visit Number 2   Authorization - Number of Visits 10   PT Start Time 0830   PT Stop Time 0910   PT Time Calculation (min) 40 min   Activity Tolerance Patient tolerated treatment well   Behavior During Therapy Restless      Past Medical History  Diagnosis Date  . Closed TBI (traumatic brain injury) approximately 40 yrs ago  . Glaucoma     No past surgical history on file.  There were no vitals filed for this visit.  Visit Diagnosis:  Stiffness of cervical spine      Subjective Assessment - 01/12/15 0836    Subjective Pt and mother present for session. Reports she is maintaining his decr. tegretol and this has been beneficial in terms of ROM. Pt will have botox the Tuesday after thanksgiving. Pt was also ill Monday vomiting, since then has had no problems.   Patient is accompained by: Family member   Pertinent History See subjective   Patient Stated Goals Mother would like to be able to feed pt, wash pt, and shaving/brushing teeth.   Currently in Pain? No/denies          Objective: Upper cervical lateral translation at C1, C2, C3 3x1 min lateral glides 3x2 min each.  Following this resting posture with decr. Lower cervical lateral glide.  STM performed on L periscapular musculature to tolerance. Pt c/o pain with this.  Elbow extension stretching with biceps STM performed, 10 min total.  Pt began to have difficulty tolerating manual tx so ended session following this. Pt in slight protective  position following this with incr. L lateral flexion and rotation.                            PT Long Term Goals - 12/13/14 [id number]    PT LONG TERM GOAL #1   Title Pt will demonstrate improved upright posture in cervical spine when eating based on Pt's mother's report as well as decr. redness on L side of mouth.   Time 12   Period Weeks   Status New   PT LONG TERM GOAL #2   Title Pt will improve cervical lateral flexion to 15 degrees R lateral flexion indicating improvement in motion to decr. strain on caregivers when assisting with feeding and bathing tasks.   Baseline Pt has lost all motion beyond midline.   Time 12   Period Weeks   Status New               Problem List There are no active problems to display for this patient.   Fisher,Benjamin 01/12/2015, 9:32 AM  [facility] [address] [place], [place], [address] Phone: [phone]   Fax:  [phone]  Name: Jacob Green MRN: [id number] Date of Birth: 08/12/1965

## 2015-01-24 ENCOUNTER — Ambulatory Visit: Payer: Medicare Other | Admitting: Physical Therapy

## 2015-01-24 DIAGNOSIS — M436 Torticollis: Secondary | ICD-10-CM

## 2015-01-24 NOTE — Therapy (Signed)
[place] [facility] [address] [place], [address] Phone: [phone]   Fax:  [phone]  Physical Therapy Treatment  Patient Details  Name: Jacob Green MRN: [id number] Date of Birth: Jul 15, 1965 No Data Recorded  Encounter Date: 01/24/2015      PT End of Session - 01/24/15 0845    Visit Number 4   Number of Visits 13   Date for PT Re-Evaluation 02/26/15   Authorization Type Gcode   Authorization - Visit Number 4   Authorization - Number of Visits 10   PT Start Time 0830   PT Stop Time 0911   PT Time Calculation (min) 41 min   Activity Tolerance Patient tolerated treatment well   Behavior During Therapy Restless      Past Medical History  Diagnosis Date  . Closed TBI (traumatic brain injury) approximately 40 yrs ago  . Glaucoma     No past surgical history on file.  There were no vitals filed for this visit.  Visit Diagnosis:  Stiffness of cervical spine      Subjective Assessment - 01/24/15 0837    Subjective Pt and mother present for session. Mother reports she will be bringing a W/C person for next session for advice.   Patient is accompained by: Family member   Pertinent History See subjective   Patient Stated Goals Mother would like to be able to feed pt, wash pt, and shaving/brushing teeth.         Objective: Educated pt and mother on w/c positioning to maintain more upriht posture via pelvic repositionining.  Seated stretch for scapular depression 5x1 min.  Same with lateral flexion to R with gentle oscillations at end range 5x1 min.  Manual PROM retration with 30 sec. Holds x8. Pt extremely stiff with this and made hand signal to stop which we did.  Performed conjugate manual therapy/MWM 5x30 rotations with oscillations to R.  Pt began to avoid manual intervention at this time so performed very gentle STM to relax pt, but this did not progress to pt allowing for further PT so  ended session at this time.                             PT Long Term Goals - 12/13/14 6962    PT LONG TERM GOAL #1   Title Pt will demonstrate improved upright posture in cervical spine when eating based on Pt's mother's report as well as decr. redness on L side of mouth.   Time 12   Period Weeks   Status New   PT LONG TERM GOAL #2   Title Pt will improve cervical lateral flexion to 15 degrees R lateral flexion indicating improvement in motion to decr. strain on caregivers when assisting with feeding and bathing tasks.   Baseline Pt has lost all motion beyond midline.   Time 12   Period Weeks   Status New               Plan - 01/24/15 0911    Clinical Impression Statement Pt demonstrated decr. tolerance for PT, decr. ROM. Noted incr. toneand stiffness possibly related to botox running out - pt has next injection the week after thanksgiving prior to next session of PT.   Pt will benefit from skilled therapeutic intervention in order to improve on the following deficits Decreased range of motion;Impaired perceived functional ability;Improper body mechanics;Decreased strength;Decreased mobility  Rehab Potential Fair   Clinical Impairments Affecting Rehab Potential TBI, family support   PT Frequency 1x / week   PT Duration 12 weeks   PT Treatment/Interventions Manual techniques;Wheelchair mobility training;Therapeutic activities;ADLs/Self Care Home Management;Functional mobility training   Consulted and Agree with Plan of Care Patient;Family member/caregiver        Problem List There are no active problems to display for this patient.   Iban Utz PT 01/24/2015, 9:59 AM  [place] [facility] [address] [place], [place], [address] Phone: [phone]   Fax:  [phone]  Name: Jacob Green MRN: [id number] Date of Birth: 09/23/1965

## 2015-02-07 ENCOUNTER — Ambulatory Visit: Payer: Medicare Other | Attending: Neurology | Admitting: Physical Therapy

## 2015-02-07 DIAGNOSIS — M6281 Muscle weakness (generalized): Secondary | ICD-10-CM | POA: Insufficient documentation

## 2015-02-07 DIAGNOSIS — M436 Torticollis: Secondary | ICD-10-CM | POA: Diagnosis present

## 2015-02-07 NOTE — Therapy (Signed)
[place] [facility] [address], [place], [address] Phone: [phone]   Fax:  [phone]  Physical Therapy Treatment  Patient Details  Name: Jacob Green MRN: [id number] Date of Birth: [birth year] No Data Recorded  Encounter Date: 02/07/2015      PT End of Session - 02/07/15 0855    Visit Number 5   Number of Visits 13   Date for PT Re-Evaluation 02/26/15   Authorization Type Gcode   Authorization - Visit Number 5   Authorization - Number of Visits 10   PT Start Time [phone]   PT Stop Time 0915   PT Time Calculation (min) 40 min   Activity Tolerance Patient tolerated treatment well   Behavior During Therapy Restless      Past Medical History  Diagnosis Date  . Closed TBI (traumatic brain injury) approximately 40 yrs ago  . Glaucoma     No past surgical history on file.  There were no vitals filed for this visit.  Visit Diagnosis:  Stiffness of cervical spine  Muscle weakness      Subjective Assessment - 02/07/15 0836    Subjective Pt and mother present for session. Pt had botox session last Tuesday. He is doing well. W/C rep present for sesion.   Patient is accompained by: Family member   Pertinent History See subjective   Patient Stated Goals Mother would like to be able to feed pt, wash pt, and shaving/brushing teeth.   Currently in Pain? No/denies       Objective: Educated pt, mother and W/C rep on postural issues related to [place] including the benefits and costs of using a tilt-in-space, buttress options to address posture which is impacting cervical spine.  CPAs performed extensively as pt tolerated incr. Grade s/p botox: grade IV 3x1 min C6-T4 performed seated with double thumb technique.   Sustained stretch with manual depression of L shoulder for R lateral flexion 15 min total with rest breaks as needed, progressed from L ear on shoulder to 25 deg. Lateral flexion to the R.  Active  tapping on R upper trap with verbal and manual cuing to have pt actively maintain neutral spinal posture.                            PT Education - 02/07/15 0854    Education provided Yes   Education Details W/C education   Person(s) Educated Patient;Parent(s)   Methods Explanation   Comprehension Verbalized understanding             PT Long Term Goals - 02/07/15 0902    PT LONG TERM GOAL #1   Title Pt will demonstrate improved upright posture in cervical spine when eating based on Pt's mother's report as well as decr. redness on L side of mouth.   Time 12   Period Weeks   Status Partially Met   PT LONG TERM GOAL #2   Title Pt will improve cervical lateral flexion to 15 degrees R lateral flexion indicating improvement in motion to decr. strain on caregivers when assisting with feeding and bathing tasks.   Baseline Pt has lost all motion beyond midline.   Time 12   Period Weeks   Status On-going   PT LONG TERM GOAL #3   Status Achieved               Plan - 02/07/15 0900    Clinical  Impression Statement Pt demosntrates significant improvement in ROM and scapular positioning in this session possibly due to botox. ROM is currently to 25 degr. R lateral flexion.   Pt will benefit from skilled therapeutic intervention in order to improve on the following deficits Decreased range of motion;Impaired perceived functional ability;Improper body mechanics;Decreased strength;Decreased mobility   Rehab Potential Fair   Clinical Impairments Affecting Rehab Potential TBI, family support   PT Frequency 1x / week   PT Duration 12 weeks   PT Treatment/Interventions Manual techniques;Wheelchair mobility training;Therapeutic activities;ADLs/Self Care Home Management;Functional mobility training   Consulted and Agree with Plan of Care Patient;Family member/caregiver        Problem List There are no active problems to display for this  patient.   Fisher,Benjamin PT 02/07/2015, 9:15 AM  [place] [facility] [address], [place], [address] Phone: [phone]   Fax:  [phone]  Name: Jacob Green MRN: [id number] Date of Birth: Jan 20, 1966

## 2015-02-21 ENCOUNTER — Ambulatory Visit: Payer: Medicare Other | Admitting: Physical Therapy

## 2015-02-21 DIAGNOSIS — M436 Torticollis: Secondary | ICD-10-CM

## 2015-02-21 NOTE — Therapy (Signed)
[place] [facility] [address] [place], [address] Phone: [phone]   Fax:  [phone]  Physical Therapy Treatment  Patient Details  Name: Jacob Green MRN: [id number] Date of Birth: 13-Jul-1965 No Data Recorded  Encounter Date: 02/21/2015      PT End of Session - 02/21/15 0835    Visit Number 6   Number of Visits 13   Date for PT Re-Evaluation 02/26/15   Authorization Type Gcode   Authorization - Visit Number 6   Authorization - Number of Visits 10   PT Start Time 0827   PT Stop Time 0174   PT Time Calculation (min) 30 min   Activity Tolerance Patient tolerated treatment well   Behavior During Therapy Agitated      Past Medical History  Diagnosis Date  . Closed TBI (traumatic brain injury) approximately 40 yrs ago  . Glaucoma     No past surgical history on file.  There were no vitals filed for this visit.  Visit Diagnosis:  Stiffness of cervical spine      Subjective Assessment - 02/21/15 0827    Subjective Pt arrived 15 min late for session, mother present for session. No change generally with neck.   Patient is accompained by: Family member   Pertinent History See subjective   Patient Stated Goals Mother would like to be able to feed pt, wash pt, and shaving/brushing teeth.   Currently in Pain? No/denies            Objective: Seated attempted stretching into lateral flexion, pt resisted with pushing with legs to avoid contact.  Seated STM performed globally over B levator, UT, scalenes, deltoids.   Following this pt tolerated some lateral flexion stretching, active cervical rotation with extensive cuing from PT and from pt's mother to achieve active participation.  Maintaining passive stretch into upright cervical posture for one sustained 5 min stretch.  Following session pt demosntrated moderate agitation - PT encouraged pt's mother to perform what STM she is capable of to  minimize reactive response to stretching.                          PT Long Term Goals - 02/07/15 0902    PT LONG TERM GOAL #1   Title Pt will demonstrate improved upright posture in cervical spine when eating based on Pt's mother's report as well as decr. redness on L side of mouth.   Time 12   Period Weeks   Status Partially Met   PT LONG TERM GOAL #2   Title Pt will improve cervical lateral flexion to 15 degrees R lateral flexion indicating improvement in motion to decr. strain on caregivers when assisting with feeding and bathing tasks.   Baseline Pt has lost all motion beyond midline.   Time 12   Period Weeks   Status On-going   PT LONG TERM GOAL #3   Status Achieved               Plan - 02/21/15 0857    Clinical Impression Statement Pt had difficulty tolerating stretching today - demonstrated significant cocontraction of extensors when attempting to stretch so primary focus of session on STM with light stretching. Pt has decr. ulcer on L side of mouth, continues to have moderate redness in two creeses in L neck with dry flaky skin whic pt's mother reports is due to difficulty maintaining upright cervical posture with  ADLs.   Pt will benefit from skilled therapeutic intervention in order to improve on the following deficits Decreased range of motion;Impaired perceived functional ability;Improper body mechanics;Decreased strength;Decreased mobility   Rehab Potential Fair   Clinical Impairments Affecting Rehab Potential TBI, family support   PT Frequency 1x / week   PT Duration 12 weeks   PT Treatment/Interventions Manual techniques;Wheelchair mobility training;Therapeutic activities;ADLs/Self Care Home Management;Functional mobility training   Consulted and Agree with Plan of Care Patient;Family member/caregiver        Problem List There are no active problems to display for this patient.   Ednamae Schiano PT DPT 02/21/2015, 9:31 AM  [place] [facility] [address] [place], [address] Phone: [phone]   Fax:  [phone]  Name: Jacob Green MRN: [id number] Date of Birth: 1965-07-20

## 2015-03-09 ENCOUNTER — Encounter: Payer: Medicare Other | Admitting: Physical Therapy

## 2015-03-10 ENCOUNTER — Ambulatory Visit: Payer: Medicare Other | Attending: Neurology | Admitting: Physical Therapy

## 2015-03-10 DIAGNOSIS — M6281 Muscle weakness (generalized): Secondary | ICD-10-CM | POA: Diagnosis present

## 2015-03-10 DIAGNOSIS — M436 Torticollis: Secondary | ICD-10-CM | POA: Diagnosis present

## 2015-03-10 NOTE — Therapy (Signed)
[place] [facility] [address] [place], [address] Phone: [phone]   Fax:  [phone]  Physical Therapy Treatment  Patient Details  Name: Jacob Green MRN: [id number] Date of Birth: September 09, 1965 No Data Recorded  Encounter Date: 03/10/2015      PT End of Session - 03/10/15 0849    Visit Number 7   Number of Visits 13   Date for PT Re-Evaluation 02/26/15   Authorization Type Gcode   Authorization - Visit Number 6   Authorization - Number of Visits 10   PT Start Time 0827   PT Stop Time 5726   PT Time Calculation (min) 30 min   Activity Tolerance Patient tolerated treatment well   Behavior During Therapy Agitated      Past Medical History  Diagnosis Date  . Closed TBI (traumatic brain injury) approximately 40 yrs ago  . Glaucoma     No past surgical history on file.  There were no vitals filed for this visit.  Visit Diagnosis:  Stiffness of cervical spine  Muscle weakness      Subjective Assessment - 03/10/15 0830    Subjective Pt was sick last week all week. Demonstrated some moderate clonus or shaking of R LE.   Patient is accompained by: Family member   Pertinent History See subjective   Patient Stated Goals Mother would like to be able to feed pt, wash pt, and shaving/brushing teeth.   Currently in Pain? No/denies          Objective: Seated extensive scapular depression mobilization (5x1 min grade II-IV).  Lateral flexion sustained stretch with overpessure on R neck to facilitate upper cervical lateral flexion stretching. Sustained this stretch on and off for approx. 15 min total, periodically taking breaks per pt request.  Attempted cuing to encourage pt to perform lateral flexion and rotation exercise - pt unable to do so, noted his resting position with incr. Cervical flexion today. Pt's mother concerned about this but as pt has inconsistent presentation of symptoms will wait for  additional information/sessions to see if this is a pattern.                       PT Education - 03/10/15 0848    Education provided Yes   Education Details educated pt mother re: clonus and communicating with MD   Person(s) Educated Patient   Methods Explanation   Comprehension Verbalized understanding             PT Long Term Goals - 02/07/15 0902    PT LONG TERM GOAL #1   Title Pt will demonstrate improved upright posture in cervical spine when eating based on Pt's mother's report as well as decr. redness on L side of mouth.   Time 12   Period Weeks   Status Partially Met   PT LONG TERM GOAL #2   Title Pt will improve cervical lateral flexion to 15 degrees R lateral flexion indicating improvement in motion to decr. strain on caregivers when assisting with feeding and bathing tasks.   Baseline Pt has lost all motion beyond midline.   Time 12   Period Weeks   Status On-going   PT LONG TERM GOAL #3   Status Achieved               Plan - 03/10/15 0857    Clinical Impression Statement Pt demonstrated difficulty with maintaining upright posture today, appeared to be  very fatigued throughout session but did tolerate stretching well.    Pt will benefit from skilled therapeutic intervention in order to improve on the following deficits Decreased range of motion;Impaired perceived functional ability;Improper body mechanics;Decreased strength;Decreased mobility   Rehab Potential Fair   Clinical Impairments Affecting Rehab Potential TBI, family support   PT Frequency 1x / week   PT Duration 12 weeks   PT Treatment/Interventions Manual techniques;Wheelchair mobility training;Therapeutic activities;ADLs/Self Care Home Management;Functional mobility training   Consulted and Agree with Plan of Care Patient;Family member/caregiver        Problem List There are no active problems to display for this patient.   Donjuan Robison PT DPT 03/10/2015, 9:35  AM  [place] [facility] [address], [place], [address] Phone: [phone]   Fax:  [phone]  Name: Jacob Green MRN: [id number] Date of Birth: 21-May-1965

## 2015-03-21 ENCOUNTER — Ambulatory Visit: Payer: Medicare Other | Admitting: Physical Therapy

## 2015-03-21 DIAGNOSIS — M436 Torticollis: Secondary | ICD-10-CM | POA: Diagnosis not present

## 2015-03-21 NOTE — Therapy (Signed)
[place] [facility] [address] [place], [address] Phone: [phone]   Fax:  [phone]  Physical Therapy Treatment  Patient Details  Name: Jacob Green MRN: [id number] Date of Birth: 1965-03-31 No Data Recorded  Encounter Date: 03/21/2015      PT End of Session - 03/21/15 0947    Visit Number 8   Number of Visits 13   Date for PT Re-Evaluation 02/26/15   Authorization Type Gcode   Authorization - Visit Number 6   Authorization - Number of Visits 10   PT Start Time 0905   PT Stop Time 0945   PT Time Calculation (min) 40 min   Activity Tolerance Patient tolerated treatment well   Behavior During Therapy Agitated      Past Medical History  Diagnosis Date  . Closed TBI (traumatic brain injury) approximately 40 yrs ago  . Glaucoma     No past surgical history on file.  There were no vitals filed for this visit.  Visit Diagnosis:  Stiffness of cervical spine      Subjective Assessment - 03/21/15 0945    Subjective pt and mother present for session, pts mother reports no continued difficulty maintainting upright posture   Patient is accompained by: Family member   Pertinent History See subjective   Patient Stated Goals Mother would like to be able to feed pt, wash pt, and shaving/brushing teeth.         Objective:   Performed extensive R lateral cervical side bend stretching initially in neutral and progressing to R lateral flexion combined with R/L rotation. Lower R cervical counter force needed with cervical stretching to decrease translation and incr R lat cervical flexion. Tactile and verbal cueing done on R side of head to gain full AROM prior to stretch followed by instructions to relax and moving into stretch.  Noted increased tone/TPs in R UT/LS/supra/infraspinatus. Performed STM of UT/LS/supra/infraspinatus bilaterally before and between cervical stretching sets. Noted decreased toned/TPs  following STM.                                  PT Long Term Goals - 02/07/15 0902    PT LONG TERM GOAL #1   Title Pt will demonstrate improved upright posture in cervical spine when eating based on Pt's mother's report as well as decr. redness on L side of mouth.   Time 12   Period Weeks   Status Partially Met   PT LONG TERM GOAL #2   Title Pt will improve cervical lateral flexion to 15 degrees R lateral flexion indicating improvement in motion to decr. strain on caregivers when assisting with feeding and bathing tasks.   Baseline Pt has lost all motion beyond midline.   Time 12   Period Weeks   Status On-going   PT LONG TERM GOAL #3   Status Achieved               Plan - 03/21/15 0948    Clinical Impression Statement Pt. communicated well during this session, preferred to use hand for communication but with cuing from PT and pt's mother able to verbalize. Lower R cervical counter force needed with cervical stretching to decrease translation and incr R lat cervical flexion. Noted increased TPs in R UT/LS/supra/infraspinatusp, tolerated stetching and STM well   Pt will benefit from skilled therapeutic intervention in order to improve on the  following deficits Decreased range of motion;Impaired perceived functional ability;Improper body mechanics;Decreased strength;Decreased mobility   Rehab Potential Fair   Clinical Impairments Affecting Rehab Potential TBI, family support   PT Frequency 1x / week   PT Duration 12 weeks   PT Treatment/Interventions Manual techniques;Wheelchair mobility training;Therapeutic activities;ADLs/Self Care Home Management;Functional mobility training   Consulted and Agree with Plan of Care Patient;Family member/caregiver        Problem List There are no active problems to display for this patient.   Fisher,Benjamin PT DPT 03/21/2015, 10:20 AM  [place] [facility] [address], [place], [address] Phone: [phone]   Fax:  [phone]  Name: Jacob Green MRN: [id number] Date of Birth: 1965/03/30

## 2015-04-04 ENCOUNTER — Ambulatory Visit: Payer: Medicare Other | Admitting: Physical Therapy

## 2015-04-04 DIAGNOSIS — M436 Torticollis: Secondary | ICD-10-CM | POA: Diagnosis not present

## 2015-04-04 NOTE — Therapy (Signed)
[facility] [address] [place], [address] Phone: [phone]   Fax:  [phone]  Physical Therapy Treatment  Patient Details  Name: KRISH BAILLY MRN: [id number] Date of Birth: December 16, 1965 No Data Recorded  Encounter Date: 04/04/2015      PT End of Session - 04/04/15 1005    Visit Number 9   Number of Visits 13   Date for PT Re-Evaluation 02/26/15   Authorization Type Gcode   Authorization - Visit Number 6   Authorization - Number of Visits 10   PT Start Time 0900   PT Stop Time 0945   PT Time Calculation (min) 45 min   Activity Tolerance Patient tolerated treatment well   Behavior During Therapy Agitated      Past Medical History  Diagnosis Date  . Closed TBI (traumatic brain injury) approximately 40 yrs ago  . Glaucoma     No past surgical history on file.  There were no vitals filed for this visit.  Visit Diagnosis:  Stiffness of cervical spine      Subjective Assessment - 04/04/15 0944    Subjective Pt. was with mother for therapy session. Mother reports increased cervical flexion "his chin seams to just keep going down to his chest"   Patient is accompained by: Family member   Pertinent History See subjective   Patient Stated Goals Mother would like to be able to feed pt, wash pt, and shaving/brushing teeth.   Multiple Pain Sites No      Objective:  Ressessed pt's AROM/PROM of cervical spine and R/L UE.  R cervical active lateral flexion combined w/ stretch done 6X45 sec.Pt was manually cued to press into PTs hand to increase active R lateral flexion. Pt was then asked to relax while PT held laterally flexed position then slowly applied stretch for increased ROM. Performed rotational cervical stretching 6X10 sec holds. Pt was asked to actively R laterally flex cervical spine with manual cueing to attain more neutrally aligned posture, followed by active L/R cervical rotation, PT  then applied over pressure stretch w/ 10 sec holds. Performed STM of L periscapular musculature between each set of stretching exercises to address increased tone and allow for relaxation for increased stretch. Pt responded well to the above treatment as demonstrated by reports of feeling ok throughout session when asked. Pt required extensive cuing to participate with shoulder activity.                             PT Education - 04/04/15 0945    Education provided Yes   Education Details pt's mother was instructed to try and maintain neutral cervical posture then moove into R/L cervical rotational stretch from this postition   Person(s) Educated Parent(s)   Methods Explanation   Comprehension Verbalized understanding             PT Long Term Goals - 02/07/15 0902    PT LONG TERM GOAL #1   Title Pt will demonstrate improved upright posture in cervical spine when eating based on Pt's mother's report as well as decr. redness on L side of mouth.   Time 12   Period Weeks   Status Partially Met   PT LONG TERM GOAL #2   Title Pt will improve cervical lateral flexion to 15 degrees R lateral flexion indicating improvement in motion to decr. strain on caregivers when assisting with feeding and bathing  tasks.   Baseline Pt has lost all motion beyond midline.   Time 12   Period Weeks   Status On-going   PT LONG TERM GOAL #3   Status Achieved               Plan - 04/04/15 1005    Clinical Impression Statement pt presents w/ continued cervical L lat flexion contracture, and increased tension in UT/MT/infraspinatus. Continue addressing soft tissue tension w/ STM prn, and increase pt's ability to actively R laterally flex cervical spine to improve upright posture for eating/ADLs      Pt will benefit from skilled therapeutic intervention in order to improve on the following deficits Decreased range of motion;Impaired perceived functional ability;Improper body  mechanics;Decreased strength;Decreased mobility   Rehab Potential Fair   Clinical Impairments Affecting Rehab Potential TBI, family support   PT Frequency 1x / week   PT Duration 12 weeks   PT Treatment/Interventions Manual techniques;Wheelchair mobility training;Therapeutic activities;ADLs/Self Care Home Management;Functional mobility training   Consulted and Agree with Plan of Care Patient;Family member/caregiver        Problem List There are no active problems to display for this patient.   [name] SPT 04/04/2015, 10:13 AM  Mont Dutton PT DPT  [facility] [address] [place], [address] Phone: [phone]   Fax:  [phone]  Name: FINNLEY LARUSSO MRN: [id number] Date of Birth: 26-Sep-1965

## 2015-04-18 ENCOUNTER — Ambulatory Visit: Payer: Medicare Other | Attending: Neurology | Admitting: Physical Therapy

## 2015-04-18 DIAGNOSIS — M6281 Muscle weakness (generalized): Secondary | ICD-10-CM | POA: Diagnosis present

## 2015-04-18 DIAGNOSIS — M436 Torticollis: Secondary | ICD-10-CM | POA: Diagnosis present

## 2015-04-18 NOTE — Therapy (Signed)
[facility] [address], [place], [address] Phone: [phone]   Fax:  [phone]  Physical Therapy Treatment  Patient Details  Name: Jacob Green MRN: [id number] Date of Birth: 1965-10-10 No Data Recorded  Encounter Date: 04/18/2015      PT End of Session - 04/18/15 0953    Visit Number 10   Number of Visits 13   Date for PT Re-Evaluation 02/26/15   Authorization Type Gcode   Authorization - Visit Number 6   Authorization - Number of Visits 10   PT Start Time 0830   PT Stop Time 0915   PT Time Calculation (min) 45 min   Activity Tolerance Patient tolerated treatment well   Behavior During Therapy Agitated      Past Medical History  Diagnosis Date  . Closed TBI (traumatic brain injury) approximately 40 yrs ago  . Glaucoma     No past surgical history on file.  There were no vitals filed for this visit.  Visit Diagnosis:  Stiffness of cervical spine  Muscle weakness      Subjective Assessment - 04/18/15 0948    Subjective Pt was accompanied with his mother today. Pt's mother reports she has been performing R lat neck flexion stretching regularly at home and feels he has been able to achieve better ROM passively. Pt's mother reports his neck is feeling less stiff with the warmer weather.     Patient is accompained by: Family member   Pertinent History See subjective   Patient Stated Goals Mother would like to be able to feed pt, wash pt, and shaving/brushing teeth.   Currently in Pain? No/denies          Objective:  Performed R lateral flexion stretching 5X45 sec, with initial cueing for active R lat flexion followed by relaxation followed by further passive stretching. Pt progressively was able increase R lateral flexion with each set, final set reaching 5 deg beyond neutral.  Performed R/L cervical rotation from neutral, pt required tactile and verbal cueing to achieve and maintain  neutral cervical posture followed by R/L cervical rotation 4X15 sec holds. Pt tolerated well as seen by decreased muscular tension and increased ROM.  Performed STM bilaterally between each of the above sets to address TPs/increased tissue tension in the UT/MT/LS/and supraspinatus. Pt had notably decreased tissue tension post STM.                         PT Education - 04/18/15 0950    Education provided Yes   Education Details Pt's mother was shown stretching technique to first attain full AROM followed by relaxation and further passive stretching to gain increased ROM   Person(s) Educated Parent(s)   Methods Explanation;Demonstration   Comprehension Verbalized understanding             PT Long Term Goals - 02/07/15 0902    PT LONG TERM GOAL #1   Title Pt will demonstrate improved upright posture in cervical spine when eating based on Pt's mother's report as well as decr. redness on L side of mouth.   Time 12   Period Weeks   Status Partially Met   PT LONG TERM GOAL #2   Title Pt will improve cervical lateral flexion to 15 degrees R lateral flexion indicating improvement in motion to decr. strain on caregivers when assisting with feeding and bathing tasks.   Baseline Pt has lost all motion  beyond midline.   Time 12   Period Weeks   Status On-going   PT LONG TERM GOAL #3   Status Achieved               Plan - 04/18/15 0954    Clinical Impression Statement Pt is currently able to demonstrate increased R cervical lateral flexion to 5 deg past neutral after stretching and verbal cueing for relaxation. Today L UT/MT/LS/and supraspinatus were showing decreased tension as seen in previous sessions, however R UT/MT/LS/and supraspinatus seem to have increased TPs and tension needing further assessment next session.    Pt will benefit from skilled therapeutic intervention in order to improve on the following deficits Decreased range of motion;Impaired perceived  functional ability;Improper body mechanics;Decreased strength;Decreased mobility   Rehab Potential Fair   Clinical Impairments Affecting Rehab Potential TBI, family support   PT Frequency 1x / week   PT Duration 12 weeks   PT Treatment/Interventions Manual techniques;Wheelchair mobility training;Therapeutic activities;ADLs/Self Care Home Management;Functional mobility training   Consulted and Agree with Plan of Care Patient;Family member/caregiver        Problem List There are no active problems to display for this patient.   [name] SPT 04/18/2015, 9:56 AM  Mont Dutton PT DPT  [facility] [address], [place], [address] Phone: [phone]   Fax:  [phone]  Name: JAQUESE Green MRN: [id number] Date of Birth: October 23, 1965

## 2015-05-02 ENCOUNTER — Ambulatory Visit: Payer: Medicare Other | Admitting: Physical Therapy

## 2015-05-02 DIAGNOSIS — M6281 Muscle weakness (generalized): Secondary | ICD-10-CM

## 2015-05-02 DIAGNOSIS — M436 Torticollis: Secondary | ICD-10-CM

## 2015-05-02 NOTE — Therapy (Signed)
[facility] [address] [place], [address] Phone: [phone]   Fax:  [phone]  Physical Therapy Treatment  Patient Details  Name: Jacob Green MRN: [id number] Date of Birth: 08/18/1965 No Data Recorded  Encounter Date: 05/02/2015      PT End of Session - 05/02/15 0849    Visit Number 11   Number of Visits 13   Date for PT Re-Evaluation 02/26/15   Authorization Type Gcode   Authorization - Visit Number 11   Authorization - Number of Visits 10   PT Start Time 0841   PT Stop Time 0910   PT Time Calculation (min) 29 min   Activity Tolerance Patient tolerated treatment well      Past Medical History  Diagnosis Date  . Closed TBI (traumatic brain injury) approximately 40 yrs ago  . Glaucoma     No past surgical history on file.  There were no vitals filed for this visit.  Visit Diagnosis:  Stiffness of cervical spine  Muscle weakness      Subjective Assessment - 05/02/15 0843    Subjective Pt and mother present for session. Arrived late for session. Pt's mother reports he has had significantly incr. "head droop" incr. neck flexion.   Patient is accompained by: Family member   Pertinent History See subjective   Patient Stated Goals Mother would like to be able to feed pt, wash pt, and shaving/brushing teeth.   Currently in Pain? No/denies            Objective: Seated manually resisted cervical extension, 5x20 sec. Isometric holds with significant cuing from PT and pt's mother.  Same performed with upper thoracic extension 5x20 sec.  End range cervical rotation stretching 20x20 sec holds. One notable cavitation with R rotation. Performed B. Same performed with lateral flexion.                     PT Education - 05/02/15 0847    Education provided Yes   Education Details Educated pt's mother regarding positioning for encouraging cervical and thoracic extension.    Person(s) Educated Patient;Parent(s)   Methods Explanation;Demonstration   Comprehension Verbalized understanding             PT Long Term Goals - 05/02/15 0854    PT LONG TERM GOAL #1   Title Pt will demonstrate improved upright posture in cervical spine when eating based on Pt's mother's report as well as decr. redness on L side of mouth.   Time 12   Period Weeks   Status Partially Met   PT LONG TERM GOAL #2   Title Pt will improve cervical lateral flexion to 15 degrees R lateral flexion indicating improvement in motion to decr. strain on caregivers when assisting with feeding and bathing tasks.   Baseline Pt has lost all motion beyond midline.   Time 12   Period Weeks   Status Achieved   PT LONG TERM GOAL #3   Title Pt will demonstrate decr. "droop" to 2-3 x per week to decr. limitation in visual field.   Time 12   Period Weeks   Status New               Plan - 05/02/15 2703    Clinical Impression Statement Pt has overall improvement in neck positioning at rest as seen by decr. redness in L lateral cervical creases. Pt does have continued "droop" in cervical posture per pt's  mother. Would benefit from continued PT to address these issues.    Pt will benefit from skilled therapeutic intervention in order to improve on the following deficits Decreased range of motion;Impaired perceived functional ability;Improper body mechanics;Decreased strength;Decreased mobility   Rehab Potential Fair   Clinical Impairments Affecting Rehab Potential TBI, family support   PT Frequency 1x / week   PT Duration 12 weeks   PT Treatment/Interventions Manual techniques;Wheelchair mobility training;Therapeutic activities;ADLs/Self Care Home Management;Functional mobility training   Consulted and Agree with Plan of Care Patient;Family member/caregiver          G-Codes - 2015-05-11 0906    Functional Assessment Tool Used Posture, ADL participation (feeding)   Functional Limitation  Changing and maintaining body position   Changing and Maintaining Body Position Current Status [phone]) At least 1 percent but less than 20 percent impaired, limited or restricted   Changing and Maintaining Body Position Goal Status (M2500) At least 1 percent but less than 20 percent impaired, limited or restricted      Problem List There are no active problems to display for this patient.   Mikyah Alamo PT DPT 05-11-2015, 9:07 AM  [facility] [place] [facility] [address] [place], [address] Phone: [phone]   Fax:  [phone]  Name: Jacob Green MRN: [id number] Date of Birth: [birth year]

## 2015-05-16 ENCOUNTER — Ambulatory Visit: Payer: Medicare Other | Admitting: Physical Therapy

## 2015-05-18 ENCOUNTER — Ambulatory Visit: Payer: Medicare Other | Attending: Neurology | Admitting: Physical Therapy

## 2015-05-18 DIAGNOSIS — M436 Torticollis: Secondary | ICD-10-CM | POA: Diagnosis present

## 2015-05-18 DIAGNOSIS — M6281 Muscle weakness (generalized): Secondary | ICD-10-CM | POA: Diagnosis present

## 2015-05-18 NOTE — Therapy (Signed)
[facility] [address] [place], [address] Phone: [phone]   Fax:  [phone]  Physical Therapy Treatment  Patient Details  Name: Jacob Green MRN: [id number] Date of Birth: [birth year] No Data Recorded  Encounter Date: 05/18/2015      PT End of Session - 05/18/15 1031    Visit Number 12   Number of Visits 13   Date for PT Re-Evaluation 07/18/15   Authorization Type Gcode   Authorization - Visit Number 12   Authorization - Number of Visits 20   PT Start Time 0906   PT Stop Time 0945   PT Time Calculation (min) 39 min   Activity Tolerance Patient tolerated treatment well      Past Medical History  Diagnosis Date  . Closed TBI (traumatic brain injury) approximately 40 yrs ago  . Glaucoma     No past surgical history on file.  There were no vitals filed for this visit.  Visit Diagnosis:  Muscle weakness  Stiffness of cervical spine      Subjective Assessment - 05/18/15 1018    Subjective Pt and mother present for session. Pt's mother reports pt has been seen by Dr. Malvin Johns who is extremeley pleased with his progress. Reports that head droop has improved and pt has been able to eat with decr. dribbling from L side of mouth.   Patient is accompained by: Family member   Pertinent History See subjective   Patient Stated Goals Mother would like to be able to feed pt, wash pt, and shaving/brushing teeth.   Currently in Pain? No/denies           Objective: Manually assisted shoulder reaching with cuing from PT and pt's mother, forward, sideways, circles. Performed with elbow bent and with cuing to maintain incr. Extension. 10 min total with wide variety of cues necessary for performance.  Manually resisting cervical lateral flexion, resistance for cuing more effective than assist from opposite side.  End range manual stretching for lateral flexion with head turns x9 min with additional  finger hold on area where pt has had red skin, to separate and "air out" red skin.                           PT Long Term Goals - 05/02/15 0854    PT LONG TERM GOAL #1   Title Pt will demonstrate improved upright posture in cervical spine when eating based on Pt's mother's report as well as decr. redness on L side of mouth.   Time 12   Period Weeks   Status Partially Met   PT LONG TERM GOAL #2   Title Pt will improve cervical lateral flexion to 15 degrees R lateral flexion indicating improvement in motion to decr. strain on caregivers when assisting with feeding and bathing tasks.   Baseline Pt has lost all motion beyond midline.   Time 12   Period Weeks   Status Achieved   PT LONG TERM GOAL #3   Title Pt will demonstrate decr. "droop" to 2-3 x per week to decr. limitation in visual field.   Time 12   Period Weeks   Status New               Plan - 05/18/15 1032    Clinical Impression Statement Noted incr. tone today in R shoulder so focused somewhat on this. emphasis on exercise today to continue to  reinforce strength in neck to minimize droop. PT reached out to neuro specialist for ideas for additional exercises to address stiffness in shoulder and neck.   Pt will benefit from skilled therapeutic intervention in order to improve on the following deficits Decreased range of motion;Impaired perceived functional ability;Improper body mechanics;Decreased strength;Decreased mobility   Rehab Potential Fair   Clinical Impairments Affecting Rehab Potential TBI, family support   PT Frequency 1x / week   PT Duration 12 weeks   PT Treatment/Interventions Manual techniques;Wheelchair mobility training;Therapeutic activities;ADLs/Self Care Home Management;Functional mobility training   Consulted and Agree with Plan of Care Patient;Family member/caregiver        Problem List There are no active problems to display for this patient.   Veldon Wager PT  DPT 05/18/2015, 10:39 AM  [facility] [address] [place], [address] Phone: [phone]   Fax:  [phone]  Name: Jacob Green MRN: [id number] Date of Birth: Jun 16, 1965

## 2015-05-30 ENCOUNTER — Encounter: Payer: Medicare Other | Admitting: Physical Therapy

## 2015-06-08 ENCOUNTER — Ambulatory Visit: Payer: Medicare Other | Attending: Neurology | Admitting: Physical Therapy

## 2015-06-08 DIAGNOSIS — M436 Torticollis: Secondary | ICD-10-CM | POA: Diagnosis not present

## 2015-06-08 DIAGNOSIS — M6281 Muscle weakness (generalized): Secondary | ICD-10-CM

## 2015-06-08 NOTE — Therapy (Signed)
[facility] [address] [place], [address] Phone: [phone]   Fax:  [phone]  Physical Therapy Treatment  Patient Details  Name: Jacob Green MRN: [id number] Date of Birth: Feb 11, 1966 No Data Recorded  Encounter Date: 06/08/2015      PT End of Session - 06/08/15 0922    Visit Number 13   Number of Visits 13   Date for PT Re-Evaluation 07/18/15   Authorization Type Gcode   Authorization - Visit Number 13   Authorization - Number of Visits 20   PT Start Time 0906   PT Stop Time 0948   PT Time Calculation (min) 42 min   Activity Tolerance Patient tolerated treatment well      Past Medical History  Diagnosis Date  . Closed TBI (traumatic brain injury) approximately 40 yrs ago  . Glaucoma     No past surgical history on file.  There were no vitals filed for this visit.  Visit Diagnosis:  Stiffness of cervical spine  Muscle weakness      Subjective Assessment - 06/08/15 0911    Subjective Pt and mother present for session. pt's mother has not been able to have pt on the floor due to a death in the family. Continues to have excess cervical flexion.   Patient is accompained by: Family member   Pertinent History See subjective   Patient Stated Goals Mother would like to be able to feed pt, wash pt, and shaving/brushing teeth.   Currently in Pain? No/denies   Multiple Pain Sites No               Objective: Extensive STM performed on paraspinals.   Attempted cervical PROM but pt resisted this so returned to [facility] for the rest of session.  Following session ROM improved from -20 deg. To neutral. Pt had no ability to follow commands today but noted improved verbal communication/able to answer questions.                  PT Education - 06/08/15 0912    Education provided Yes   Education Details reemphasized HEP/floor   Person(s) Educated Patient;Parent(s)   Methods  Explanation   Comprehension Verbalized understanding             PT Long Term Goals - 05/02/15 0854    PT LONG TERM GOAL #1   Title Pt will demonstrate improved upright posture in cervical spine when eating based on Pt's mother's report as well as decr. redness on L side of mouth.   Time 12   Period Weeks   Status Partially Met   PT LONG TERM GOAL #2   Title Pt will improve cervical lateral flexion to 15 degrees R lateral flexion indicating improvement in motion to decr. strain on caregivers when assisting with feeding and bathing tasks.   Baseline Pt has lost all motion beyond midline.   Time 12   Period Weeks   Status Achieved   PT LONG TERM GOAL #3   Title Pt will demonstrate decr. "droop" to 2-3 x per week to decr. limitation in visual field.   Time 12   Period Weeks   Status New               Plan - 06/08/15 4709    Clinical Impression Statement Pt had difficulty tolerating stretching today. Focused on STM performed primarily before and after ROM. noted worse resting posture, pt is now at  end range lateral flexion and forward flexion, and decr. response to verbal commands to upright posture.   Pt will benefit from skilled therapeutic intervention in order to improve on the following deficits Decreased range of motion;Impaired perceived functional ability;Improper body mechanics;Decreased strength;Decreased mobility   Rehab Potential Fair   Clinical Impairments Affecting Rehab Potential TBI, family support   PT Frequency 1x / week   PT Duration 12 weeks   PT Treatment/Interventions Manual techniques;Wheelchair mobility training;Therapeutic activities;ADLs/Self Care Home Management;Functional mobility training   Consulted and Agree with Plan of Care Patient;Family member/caregiver        Problem List There are no active problems to display for this patient.   Jacob Green PT DPT 06/08/2015, 9:52 AM  [facility] [address] [place], [address] Phone: [phone]   Fax:  [phone]  Name: Jacob Green MRN: [id number] Date of Birth: February 05, 1966

## 2015-06-13 ENCOUNTER — Encounter: Payer: Medicare Other | Admitting: Physical Therapy

## 2015-06-15 ENCOUNTER — Ambulatory Visit: Payer: Medicare Other | Admitting: Physical Therapy

## 2015-06-15 DIAGNOSIS — M436 Torticollis: Secondary | ICD-10-CM | POA: Diagnosis not present

## 2015-06-15 NOTE — Therapy (Signed)
[facility] [address], [place], [address] Phone: [phone]   Fax:  [phone]  Physical Therapy Treatment  Patient Details  Name: Jacob Green MRN: [id number] Date of Birth: 1966/02/12 No Data Recorded  Encounter Date: 06/15/2015      PT End of Session - 06/15/15 0908    Visit Number 14   Number of Visits 26   Date for PT Re-Evaluation 07/18/15   Authorization Type Gcode   Authorization - Visit Number 14   Authorization - Number of Visits 20   PT Start Time 0900   PT Stop Time 0940   PT Time Calculation (min) 40 min   Activity Tolerance Patient tolerated treatment well      Past Medical History  Diagnosis Date  . Closed TBI (traumatic brain injury) approximately 40 yrs ago  . Glaucoma     No past surgical history on file.  There were no vitals filed for this visit.      Subjective Assessment - 06/15/15 0904    Subjective Pt and mother present for session. no changes noted per pt's mother.   Patient is accompained by: Family member   Pertinent History See subjective   Patient Stated Goals Mother would like to be able to feed pt, wash pt, and shaving/brushing teeth.   Currently in Pain? No/denies               Objective: Assessed multiple areas of redness where pt has pressure related irritation. Specifically two horizontal lines on L cervical region, L oral crease. See assessment  Focal neck stretch to address upper cervical lateral flexion due to continued redness in superior horizontal line, including STM initially to relax muscles and improve pt tolerance for more irritating stretch.  CPAs grade II 3x1 min C6-T3. Noted self selected improvement in lateral flexion posture following this.  Global neck stretch, achieved 10 deg. Lateral flexion.                  PT Education - 06/15/15 0907    Education provided Yes   Education Details two week break from PT  in the near future to assess for the need for maintenance therapy.             PT Long Term Goals - 05/02/15 0854    PT LONG TERM GOAL #1   Title Pt will demonstrate improved upright posture in cervical spine when eating based on Pt's mother's report as well as decr. redness on L side of mouth.   Time 12   Period Weeks   Status Partially Met   PT LONG TERM GOAL #2   Title Pt will improve cervical lateral flexion to 15 degrees R lateral flexion indicating improvement in motion to decr. strain on caregivers when assisting with feeding and bathing tasks.   Baseline Pt has lost all motion beyond midline.   Time 12   Period Weeks   Status Achieved   PT LONG TERM GOAL #3   Title Pt will demonstrate decr. "droop" to 2-3 x per week to decr. limitation in visual field.   Time 12   Period Weeks   Status New               Plan - 06/15/15 0908    Clinical Impression Statement Improved tolerance for stretching. noted near abolishment of inferior line of redness in L neck. Superior line is still significant with some scale noted on  this region. Next round of botox is at the end of May. Also noted decr. redness in L face near mouth.   Rehab Potential Fair   Clinical Impairments Affecting Rehab Potential TBI, family support   PT Frequency 1x / week   PT Duration 12 weeks   PT Treatment/Interventions Manual techniques;Wheelchair mobility training;Therapeutic activities;ADLs/Self Care Home Management;Functional mobility training   Consulted and Agree with Plan of Care Patient;Family member/caregiver      Patient will benefit from skilled therapeutic intervention in order to improve the following deficits and impairments:  Decreased range of motion, Impaired perceived functional ability, Improper body mechanics, Decreased strength, Decreased mobility  Visit Diagnosis: Stiffness of cervical spine     Problem List There are no active problems to display for this  patient.   Fisher,Benjamin PT DPT 06/15/2015, 11:04 AM  [facility] [address], [place], [address] Phone: [phone]   Fax:  [phone]  Name: CECILIO OHLRICH MRN: [id number] Date of Birth: 08-11-1965

## 2015-06-28 ENCOUNTER — Ambulatory Visit: Payer: Medicare Other | Admitting: Physical Therapy

## 2015-06-28 DIAGNOSIS — M436 Torticollis: Secondary | ICD-10-CM

## 2015-06-28 NOTE — Therapy (Signed)
[facility] [address], [place], [address] Phone: [phone]   Fax:  [phone]  Physical Therapy Treatment  Patient Details  Name: Jacob Green MRN: [id number] Date of Birth: [birth year] No Data Recorded  Encounter Date: 06/28/2015      PT End of Session - 06/28/15 0955    Visit Number 14   Number of Visits 26   Date for PT Re-Evaluation 07/18/15   Authorization Type Gcode   Authorization - Visit Number 14   Authorization - Number of Visits 20   PT Start Time 0920   PT Stop Time 1000   PT Time Calculation (min) 40 min   Activity Tolerance Patient tolerated treatment well      Past Medical History  Diagnosis Date  . Closed TBI (traumatic brain injury) approximately 40 yrs ago  . Glaucoma     No past surgical history on file.  There were no vitals filed for this visit.      Subjective Assessment - 06/28/15 0954    Subjective Pt and mother present for session. Pt's mother reports decr. redness in L neck. no other changes. Noted pt is significantly more verbal, able to answer simple questions throughout session.   Patient is accompained by: Family member   Pertinent History See subjective   Patient Stated Goals Mother would like to be able to feed pt, wash pt, and shaving/brushing teeth.   Currently in Pain? No/denies                     Objective: All treatment performed in seated.  Extensive STM performed on B UT, LS. Following this cued pt for upright posture. Noted incr. Lateral flexion to -25 deg.  CPAs grade II 5x1 min T1-T4 Following this lateral flexion improved to -10 deg.  Manually assisted cervical rotation - PT cued pt to perform and used manual cuing and correction to perform safely.  Following session no change in self-selected cervical posture.                 PT Long Term Goals - 06/28/15 1046    PT LONG TERM GOAL #1   Title Pt will  demonstrate improved upright posture in cervical spine when eating based on Pt's mother's report as well as decr. redness on L side of mouth.   Time 12   Period Weeks   Status Achieved   PT LONG TERM GOAL #2   Title Pt will improve cervical lateral flexion to 15 degrees R lateral flexion indicating improvement in motion to decr. strain on caregivers when assisting with feeding and bathing tasks.   Baseline Pt has lost all motion beyond midline.   Time 12   Period Weeks   Status Achieved   PT LONG TERM GOAL #3   Title Pt will demonstrate decr. "droop" to 2-3 x per week to decr. limitation in visual field.   Time 12   Period Weeks   Status Partially Met               Plan - 06/28/15 0959    Clinical Impression Statement Pt demonstrated increased resistance to acheiving cervical midline but still able to achieve with stretching, ROM and PROM. Abolishment of redness along L neck. at end range today -10 deg. from neutral.   Rehab Potential Fair   Clinical Impairments Affecting Rehab Potential TBI, family support   PT Frequency 1x / week   PT  Duration 12 weeks   PT Treatment/Interventions Manual techniques;Wheelchair mobility training;Therapeutic activities;ADLs/Self Care Home Management;Functional mobility training   Consulted and Agree with Plan of Care Patient;Family member/caregiver      Patient will benefit from skilled therapeutic intervention in order to improve the following deficits and impairments:  Decreased range of motion, Impaired perceived functional ability, Improper body mechanics, Decreased strength, Decreased mobility  Visit Diagnosis: Stiffness of cervical spine     Problem List There are no active problems to display for this patient.   Fisher,Benjamin PT DPT 06/28/2015, 11:04 AM  [facility] [address], [place], [address] Phone: [phone]   Fax:  [phone]  Name:  Jacob Green MRN: [id number] Date of Birth: 10-13-1965

## 2015-07-04 ENCOUNTER — Encounter: Payer: Medicare Other | Admitting: Physical Therapy

## 2015-07-12 ENCOUNTER — Ambulatory Visit: Payer: Medicare Other | Attending: Neurology | Admitting: Physical Therapy

## 2015-07-12 DIAGNOSIS — M6281 Muscle weakness (generalized): Secondary | ICD-10-CM | POA: Insufficient documentation

## 2015-07-12 DIAGNOSIS — M436 Torticollis: Secondary | ICD-10-CM | POA: Diagnosis not present

## 2015-07-12 NOTE — Therapy (Signed)
[facility] [address], [place], [address] Phone: [phone]   Fax:  [phone]  Physical Therapy Treatment  Patient Details  Name: Jacob Green MRN: [id number] Date of Birth: [birth year] No Data Recorded  Encounter Date: 07/12/2015      PT End of Session - 07/12/15 0946    Visit Number 15   Number of Visits 26   Date for PT Re-Evaluation 07/18/15   Authorization Type Gcode   Authorization - Visit Number 14   Authorization - Number of Visits 20   PT Start Time 0910   PT Stop Time 0935   PT Time Calculation (min) 25 min   Activity Tolerance Treatment limited secondary to agitation  initially pt tolerated well, incr. agitation 2/2 cavitation   Behavior During Therapy Agitated      Past Medical History  Diagnosis Date  . Closed TBI (traumatic brain injury) approximately 40 yrs ago  . Glaucoma     No past surgical history on file.  There were no vitals filed for this visit.      Subjective Assessment - 07/12/15 0943    Subjective Pt and mother present for session. Pt's mother reports he had botox injection, possibly 4 injections in peri-cervical region. Reports she reported to MD incr. cervical "droop" and asked if certain muscles could be avoided. Since that time pt has demonstrated decr. tolerance for stretching.   Patient is accompained by: Family member   Pertinent History See subjective   Patient Stated Goals Mother would like to be able to feed pt, wash pt, and shaving/brushing teeth.   Currently in Pain? No/denies               Objective:  Performed light to moderate repositioning for pt with cuing to maintain this position. Pt demonstrated incr. Cervical flexion at rest and demonstrated very mild agitation with corrections.  Performed very light STM on periscapular, peri-cervical region. Educated pt;s mother regarding proper positioning for self performance of  compression.  Manual stretching into lateral flexion with and without oppsite hand to facilitate upper cervical motion. With upper cervical motion pt improved to neutral. Following this pt had one cavitation upon returning neck to self selected position. Following this became agitated and pt's mother needed to help pt calm down.                    PT Education - 07/12/15 0944    Education provided Yes   Education Details educated pt's mother regarding length of time between injections based on rehab presentation.   Person(s) Educated Patient   Methods Explanation   Comprehension Verbalized understanding             PT Long Term Goals - 06/28/15 1046    PT LONG TERM GOAL #1   Title Pt will demonstrate improved upright posture in cervical spine when eating based on Pt's mother's report as well as decr. redness on L side of mouth.   Time 12   Period Weeks   Status Achieved   PT LONG TERM GOAL #2   Title Pt will improve cervical lateral flexion to 15 degrees R lateral flexion indicating improvement in motion to decr. strain on caregivers when assisting with feeding and bathing tasks.   Baseline Pt has lost all motion beyond midline.   Time 12   Period Weeks   Status Achieved   PT LONG TERM GOAL #3   Title Pt  will demonstrate decr. "droop" to 2-3 x per week to decr. limitation in visual field.   Time 12   Period Weeks   Status Partially Met               Plan - 07/12/15 0947    Clinical Impression Statement Pt demonstrated very poor cervical posture at start of session, incr. cervical flexion, L lateral flexion and L rotation. Pt became extremely agitated within session and did not allow for additional treatment. This occurred soon after pt had short bout more sustained upper cervical lateral flexion with one cavitation. discontinued PT for today following this.   Rehab Potential Fair   Clinical Impairments Affecting Rehab Potential TBI, family support   PT  Frequency 1x / week   PT Duration 12 weeks   PT Treatment/Interventions Manual techniques;Wheelchair mobility training;Therapeutic activities;ADLs/Self Care Home Management;Functional mobility training   Consulted and Agree with Plan of Care Patient;Family member/caregiver      Patient will benefit from skilled therapeutic intervention in order to improve the following deficits and impairments:  Decreased range of motion, Impaired perceived functional ability, Improper body mechanics, Decreased strength, Decreased mobility  Visit Diagnosis: Stiffness of cervical spine     Problem List There are no active problems to display for this patient.   Fisher,Benjamin PT DPT 07/12/2015, 4:18 PM  [facility] [address], [place], [address] Phone: [phone]   Fax:  [phone]  Name: Jacob Green MRN: [id number] Date of Birth: Aug 12, 1965

## 2015-07-14 ENCOUNTER — Encounter: Payer: Medicare Other | Admitting: Physical Therapy

## 2015-07-18 ENCOUNTER — Encounter: Payer: Medicare Other | Admitting: Physical Therapy

## 2015-07-21 ENCOUNTER — Ambulatory Visit: Payer: Medicare Other | Admitting: Physical Therapy

## 2015-07-21 DIAGNOSIS — M436 Torticollis: Secondary | ICD-10-CM

## 2015-07-21 NOTE — Therapy (Signed)
[facility] [address], [place], [address] Phone: [phone]   Fax:  [phone]  Physical Therapy Treatment  Patient Details  Name: Jacob Green MRN: [id number] Date of Birth: [birth year] No Data Recorded  Encounter Date: 07/21/2015      PT End of Session - 07/21/15 0953    Visit Number 16   Number of Visits 26   Date for PT Re-Evaluation 09/15/15   Authorization Type Gcode   Authorization - Visit Number 14   Authorization - Number of Visits 20   PT Start Time 0830   PT Stop Time 0910   PT Time Calculation (min) 40 min   Activity Tolerance Treatment limited secondary to agitation  initially pt tolerated well, incr. agitation 2/2 cavitation   Behavior During Therapy Agitated      Past Medical History  Diagnosis Date  . Closed TBI (traumatic brain injury) approximately 40 yrs ago  . Glaucoma     No past surgical history on file.  There were no vitals filed for this visit.      Subjective Assessment - 07/21/15 0916    Subjective Pt and mother present for session. Pt's mother reports he had a "bad" tuesday where he was agitated.    Patient is accompained by: Family member   Pertinent History See subjective   Patient Stated Goals Mother would like to be able to feed pt, wash pt, and shaving/brushing teeth.   Currently in Pain? No/denies             Objective: All manual therapy performed in seated. Initially performed light STM to L shoulder in periscapular region to reduce pt agitation. Following this able to perform stretch to -15 deg. Neutral focus of session on improving this. Performed scapular depression. Lateral flexion stretching including oscillation, addition of rotation. Pt had several instances of using his LUE to limit PT stretches requiring rest breaks and encouragement from PT and pt's mother to continue with therapy session. Pt able to continue following this, performed  more aggressive upright postural stretching, able to achieve neutral.                    PT Education - 07/21/15 0919    Education provided Yes   Education Details modified pt's mother's performance of stretches to focus on avoiding pushing into pt selected resistance   Person(s) Educated Patient;Parent(s)   Methods Explanation;Demonstration   Comprehension Verbalized understanding;Returned demonstration             PT Long Term Goals - 06/28/15 1046    PT LONG TERM GOAL #1   Title Pt will demonstrate improved upright posture in cervical spine when eating based on Pt's mother's report as well as decr. redness on L side of mouth.   Time 12   Period Weeks   Status Achieved   PT LONG TERM GOAL #2   Title Pt will improve cervical lateral flexion to 15 degrees R lateral flexion indicating improvement in motion to decr. strain on caregivers when assisting with feeding and bathing tasks.   Baseline Pt has lost all motion beyond midline.   Time 12   Period Weeks   Status Achieved   PT LONG TERM GOAL #3   Title Pt will demonstrate decr. "droop" to 2-3 x per week to decr. limitation in visual field.   Time 12   Period Weeks   Status Partially Met  Plan - 07/21/15 0954    Clinical Impression Statement Overall pt is demonstrating incr. resistance to being stretched and to having head/neck maneuvered passively. Also noting incr. cervical "droop" (resting position with more forward flexion) possibly related to botox injections ~4 wks ago. Will continue to address ROM, slight L cervical areas of redness from this positioning. Discussed with pt's mother possibly taking a short break from PT  to assess the impact we are having on maintenance/wellness, will see pt for additional session next week to continue this discussion.   Rehab Potential Fair   Clinical Impairments Affecting Rehab Potential TBI, family support   PT Frequency 1x / week   PT Duration 12  weeks   PT Treatment/Interventions Manual techniques;Wheelchair mobility training;Therapeutic activities;ADLs/Self Care Home Management;Functional mobility training   Consulted and Agree with Plan of Care Patient;Family member/caregiver      Patient will benefit from skilled therapeutic intervention in order to improve the following deficits and impairments:  Decreased range of motion, Impaired perceived functional ability, Improper body mechanics, Decreased strength, Decreased mobility  Visit Diagnosis: Stiffness of cervical spine     Problem List There are no active problems to display for this patient.   Fisher,Benjamin PT DPT 07/21/2015, 10:46 AM  [facility] [address], [place], [address] Phone: [phone]   Fax:  [phone]  Name: Jacob Green MRN: [id number] Date of Birth: [birth year]

## 2015-07-26 ENCOUNTER — Ambulatory Visit: Payer: Medicare Other | Admitting: Physical Therapy

## 2015-07-26 DIAGNOSIS — M436 Torticollis: Secondary | ICD-10-CM

## 2015-07-26 DIAGNOSIS — M6281 Muscle weakness (generalized): Secondary | ICD-10-CM

## 2015-07-26 NOTE — Therapy (Signed)
[facility] [address], [place], [address] Phone: [phone]   Fax:  [phone]  Physical Therapy Treatment  Patient Details  Name: Jacob Green MRN: [id number] Date of Birth: Jan 24, 1966 No Data Recorded  Encounter Date: 07/26/2015      PT End of Session - 07/26/15 1023    Visit Number 17   Number of Visits 26   Date for PT Re-Evaluation 09/15/15   Authorization Type Gcode   Authorization - Visit Number 14   Authorization - Number of Visits 20   PT Start Time 0900   PT Stop Time 0940   PT Time Calculation (min) 40 min   Activity Tolerance Treatment limited secondary to agitation  initially pt tolerated well, incr. agitation 2/2 cavitation   Behavior During Therapy Agitated      Past Medical History  Diagnosis Date  . Closed TBI (traumatic brain injury) approximately 40 yrs ago  . Glaucoma     No past surgical history on file.  There were no vitals filed for this visit.      Subjective Assessment - 07/26/15 1004    Subjective Pt and mother present for session. Reports decr. agitation but incr. resistance when she tries to stretch son for ADLs shaving and bathing/cleaning   Patient is accompained by: Family member   Pertinent History See subjective   Patient Stated Goals Mother would like to be able to feed pt, wash pt, and shaving/brushing teeth.   Currently in Pain? No/denies               Objective: Light petrissage performed on deltoid, posterior deltoid, RTC musculature, rhomboid/mid trap, and B multifidi.  Following this pt able to achieve -25 deg. Lateral flexion with cuing.  Compression performed on posterior deltoid, rhomboid x10 min total.  J-stroke performed on B multifidi, x10 min.  Following this period of manually assisted cervical repositioning using verbal and tactile cuing. Pt able to achieve cervical neutral with this.  Noted considerable resistance from  pt with initial performance of stretching so deferred.                  PT Education - 07/26/15 1021    Education provided Yes   Education Details educated pt's mother on the importance of hand positioning for STM   Person(s) Educated Patient   Methods Explanation   Comprehension Verbalized understanding             PT Long Term Goals - 06/28/15 1046    PT LONG TERM GOAL #1   Title Pt will demonstrate improved upright posture in cervical spine when eating based on Pt's mother's report as well as decr. redness on L side of mouth.   Time 12   Period Weeks   Status Achieved   PT LONG TERM GOAL #2   Title Pt will improve cervical lateral flexion to 15 degrees R lateral flexion indicating improvement in motion to decr. strain on caregivers when assisting with feeding and bathing tasks.   Baseline Pt has lost all motion beyond midline.   Time 12   Period Weeks   Status Achieved   PT LONG TERM GOAL #3   Title Pt will demonstrate decr. "droop" to 2-3 x per week to decr. limitation in visual field.   Time 12   Period Weeks   Status Partially Met               Plan -  07/26/15 1025    Clinical Impression Statement Pt continues to demonstrate incr. resistance so focus of session on addressing potential sources of pain in shoulder and neck in case this is the reason for pt having incr. resistance. Pt did respond to session with sinfiicant improvement in self positioned posture with cuing following manual intervention.   Rehab Potential Fair   Clinical Impairments Affecting Rehab Potential TBI, family support   PT Frequency 1x / week   PT Duration 12 weeks   PT Treatment/Interventions Manual techniques;Wheelchair mobility training;Therapeutic activities;ADLs/Self Care Home Management;Functional mobility training   Consulted and Agree with Plan of Care Patient;Family member/caregiver      Patient will benefit from skilled therapeutic intervention in order to  improve the following deficits and impairments:  Decreased range of motion, Impaired perceived functional ability, Improper body mechanics, Decreased strength, Decreased mobility  Visit Diagnosis: Stiffness of cervical spine  Muscle weakness     Problem List There are no active problems to display for this patient.   Iris Tatsch PT DPT 07/26/2015, 4:26 PM  [facility] [address] [place], [address] Phone: [phone]   Fax:  [phone]  Name: Jacob Green MRN: [id number] Date of Birth: 1965-06-29

## 2015-08-03 ENCOUNTER — Encounter: Payer: Medicare Other | Admitting: Physical Therapy

## 2015-08-08 ENCOUNTER — Ambulatory Visit: Payer: Medicare Other | Attending: Neurology | Admitting: Physical Therapy

## 2015-08-08 DIAGNOSIS — M436 Torticollis: Secondary | ICD-10-CM | POA: Insufficient documentation

## 2015-08-08 DIAGNOSIS — M6281 Muscle weakness (generalized): Secondary | ICD-10-CM | POA: Diagnosis present

## 2015-08-08 NOTE — Therapy (Signed)
[facility] [address], [place], [address] Phone: [phone]   Fax:  [phone]  Physical Therapy Treatment  Patient Details  Name: Jacob Green MRN: [id number] Date of Birth: 07/03/1965 No Data Recorded  Encounter Date: 08/08/2015      PT End of Session - 08/08/15 0825    Visit Number 18   Number of Visits 26   Date for PT Re-Evaluation 09/15/15   Authorization Type Gcode   Authorization - Visit Number 14   Authorization - Number of Visits 20   PT Start Time 0820   PT Stop Time 0900   PT Time Calculation (min) 40 min   Activity Tolerance Treatment limited secondary to agitation  initially pt tolerated well, incr. agitation 2/2 cavitation   Behavior During Therapy Agitated      Past Medical History  Diagnosis Date  . Closed TBI (traumatic brain injury) approximately 40 yrs ago  . Glaucoma     No past surgical history on file.  There were no vitals filed for this visit.      Subjective Assessment - 08/08/15 0824    Subjective Pt and mother' present for session. Pt's mother reports he his having notably less resistance.   Patient is accompained by: Family member   Pertinent History See subjective   Patient Stated Goals Mother would like to be able to feed pt, wash pt, and shaving/brushing teeth.   Currently in Pain? No/denies              Objective: STM/oscillations performed for scapular depression x5 min total. Following this pt had improvement in ROM to 10 deg. Rotation, neutral cervical posture.  Extended PROM stretching with verbal and tactile cuing to improve pt participation for R lateral flexionand rotation. Following this achieved 20 deg. Lateral flexion.  UE reaching to work on self directed cervical spine activation. Cued pt to "find my hand" and then reach out with his hand. Initially pt compensated with arm reaching out and feeling rather than looking. With  considerable cuing pt then utilized LUE to manually lift eyelid, and was then able to improve finding. PT encouraged pt's mother to continue to monitor this.                        PT Long Term Goals - 06/28/15 1046    PT LONG TERM GOAL #1   Title Pt will demonstrate improved upright posture in cervical spine when eating based on Pt's mother's report as well as decr. redness on L side of mouth.   Time 12   Period Weeks   Status Achieved   PT LONG TERM GOAL #2   Title Pt will improve cervical lateral flexion to 15 degrees R lateral flexion indicating improvement in motion to decr. strain on caregivers when assisting with feeding and bathing tasks.   Baseline Pt has lost all motion beyond midline.   Time 12   Period Weeks   Status Achieved   PT LONG TERM GOAL #3   Title Pt will demonstrate decr. "droop" to 2-3 x per week to decr. limitation in visual field.   Time 12   Period Weeks   Status Partially Met               Plan - 08/08/15 0569    Clinical Impression Statement Pt with significantly improved ROM in cervical spine region. noted incr. ptosis in eyelid and pt's mother  reported within session that this has been happening for the past few weeks. Pt also when performing ther ex had to manually elevate eyelid in order to find objects, which appears to be a new development.   Rehab Potential Fair   Clinical Impairments Affecting Rehab Potential TBI, family support   PT Frequency 1x / week   PT Duration 12 weeks   PT Treatment/Interventions Manual techniques;Wheelchair mobility training;Therapeutic activities;ADLs/Self Care Home Management;Functional mobility training   Consulted and Agree with Plan of Care Patient;Family member/caregiver      Patient will benefit from skilled therapeutic intervention in order to improve the following deficits and impairments:  Decreased range of motion, Impaired perceived functional ability, Improper body mechanics,  Decreased strength, Decreased mobility  Visit Diagnosis: Stiffness of cervical spine  Muscle weakness     Problem List There are no active problems to display for this patient.   Giulianna Rocha PT DPT 08/08/2015, 9:03 AM  [facility] [address] [place], [address] Phone: [phone]   Fax:  [phone]  Name: Jacob Green MRN: [id number] Date of Birth: 1965/05/15

## 2015-08-19 ENCOUNTER — Emergency Department: Payer: Medicare Other

## 2015-08-19 ENCOUNTER — Inpatient Hospital Stay
Admission: EM | Admit: 2015-08-19 | Discharge: 2015-08-22 | DRG: 872 | Disposition: A | Discharge status: Home / Self Care | Payer: Medicare Other | Attending: Internal Medicine | Admitting: Internal Medicine

## 2015-08-19 ENCOUNTER — Encounter: Payer: Self-pay | Admitting: Emergency Medicine

## 2015-08-19 DIAGNOSIS — A419 Sepsis, unspecified organism: Secondary | ICD-10-CM | POA: Diagnosis present

## 2015-08-19 DIAGNOSIS — Z87820 Personal history of traumatic brain injury: Secondary | ICD-10-CM

## 2015-08-19 DIAGNOSIS — I959 Hypotension, unspecified: Secondary | ICD-10-CM | POA: Diagnosis present

## 2015-08-19 DIAGNOSIS — E876 Hypokalemia: Secondary | ICD-10-CM | POA: Diagnosis present

## 2015-08-19 DIAGNOSIS — N390 Urinary tract infection, site not specified: Secondary | ICD-10-CM | POA: Diagnosis present

## 2015-08-19 DIAGNOSIS — Z79899 Other long term (current) drug therapy: Secondary | ICD-10-CM | POA: Diagnosis not present

## 2015-08-19 DIAGNOSIS — B9620 Unspecified Escherichia coli [E. coli] as the cause of diseases classified elsewhere: Secondary | ICD-10-CM | POA: Diagnosis present

## 2015-08-19 DIAGNOSIS — H409 Unspecified glaucoma: Secondary | ICD-10-CM | POA: Diagnosis present

## 2015-08-19 DIAGNOSIS — Z87440 Personal history of urinary (tract) infections: Secondary | ICD-10-CM | POA: Diagnosis not present

## 2015-08-19 DIAGNOSIS — H5704 Mydriasis: Secondary | ICD-10-CM | POA: Diagnosis present

## 2015-08-19 HISTORY — DX: Unspecified convulsions: R56.9

## 2015-08-19 HISTORY — DX: Urinary tract infection, site not specified: N39.0

## 2015-08-19 LAB — URINALYSIS COMPLETE WITH MICROSCOPIC (ARMC ONLY)
Bilirubin Urine: NEGATIVE
GLUCOSE, UA: NEGATIVE mg/dL
KETONES UR: NEGATIVE mg/dL
NITRITE: NEGATIVE
Protein, ur: 100 mg/dL — AB
Specific Gravity, Urine: 1.032 — ABNORMAL HIGH (ref 1.005–1.030)
pH: 6 (ref 5.0–8.0)

## 2015-08-19 LAB — COMPREHENSIVE METABOLIC PANEL
ALBUMIN: 3.9 g/dL (ref 3.5–5.0)
ALT: 32 U/L (ref 17–63)
ANION GAP: 8 (ref 5–15)
AST: 26 U/L (ref 15–41)
Alkaline Phosphatase: 85 U/L (ref 38–126)
BUN: 15 mg/dL (ref 6–20)
CHLORIDE: 104 mmol/L (ref 101–111)
CO2: 25 mmol/L (ref 22–32)
Calcium: 8.7 mg/dL — ABNORMAL LOW (ref 8.9–10.3)
Creatinine, Ser: 0.65 mg/dL (ref 0.61–1.24)
GFR calc Af Amer: >60 mL/min (ref >60)
GFR calc non Af Amer: >60 mL/min (ref >60)
GLUCOSE: 102 mg/dL — AB (ref 65–99)
POTASSIUM: 3.6 mmol/L (ref 3.5–5.1)
Sodium: 137 mmol/L (ref 135–145)
Total Bilirubin: 0.6 mg/dL (ref 0.3–1.2)
Total Protein: 6.9 g/dL (ref 6.5–8.1)

## 2015-08-19 LAB — CBC WITH DIFFERENTIAL/PLATELET
BASOS ABS: 0 10*3/uL (ref 0–0.1)
EOS ABS: 0 10*3/uL (ref 0–0.7)
HCT: 40.1 % (ref 40.0–52.0)
Hemoglobin: 13.6 g/dL (ref 13.0–18.0)
Lymphocytes Relative: 3 %
Lymphs Abs: 0.6 10*3/uL — ABNORMAL LOW (ref 1.0–3.6)
MCH: 29.4 pg (ref 26.0–34.0)
MCHC: 33.9 g/dL (ref 32.0–36.0)
MCV: 87 fL (ref 80.0–100.0)
MONO ABS: 2 10*3/uL — AB (ref 0.2–1.0)
Monocytes Relative: 10 %
NEUTROS ABS: 17.5 10*3/uL — AB (ref 1.4–6.5)
Neutrophils Relative %: 87 %
PLATELETS: 129 10*3/uL — AB (ref 150–440)
RBC: 4.61 MIL/uL (ref 4.40–5.90)
RDW: 13.5 % (ref 11.5–14.5)
WBC: 20 10*3/uL — ABNORMAL HIGH (ref 3.8–10.6)

## 2015-08-19 LAB — LIPASE, BLOOD: LIPASE: 17 U/L (ref 11–51)

## 2015-08-19 LAB — LACTIC ACID, PLASMA
LACTIC ACID, VENOUS: 1.6 mmol/L (ref 0.5–2.0)
LACTIC ACID, VENOUS: 2.3 mmol/L — AB (ref 0.5–2.0)

## 2015-08-19 MED ORDER — ACETAMINOPHEN 650 MG RE SUPP: 650.0000 mg | Freq: Four times a day (QID) | RECTAL | Status: DC | PRN

## 2015-08-19 MED ORDER — GLYCERIN (LAXATIVE) 2.1 G RE SUPP
1.0000 | RECTAL | Status: DC | PRN
Start: 2015-08-19 — End: 2015-08-22
  Filled 2015-08-19: qty 1

## 2015-08-19 MED ORDER — PAROXETINE HCL 20 MG PO TABS
20.0000 mg | ORAL_TABLET | Freq: Two times a day (BID) | ORAL | Status: DC
  Administered 2015-08-19 – 2015-08-22 (×7): 20 mg via ORAL
  Filled 2015-08-19 (×5): qty 1

## 2015-08-19 MED ORDER — CARBAMAZEPINE 200 MG PO TABS
400.0000 mg | ORAL_TABLET | Freq: Every day | ORAL | Status: DC
  Filled 2015-08-19: qty 2

## 2015-08-19 MED ORDER — ENOXAPARIN SODIUM 40 MG/0.4ML ~~LOC~~ SOLN
40.0000 mg | SUBCUTANEOUS | Status: DC
  Administered 2015-08-19 – 2015-08-21 (×3): 40 mg via SUBCUTANEOUS
  Filled 2015-08-19 (×3): qty 0.4

## 2015-08-19 MED ORDER — BENZOYL PEROXIDE-ERYTHROMYCIN 5-3 % EX GEL: 1.0000 "application " | Freq: Two times a day (BID) | CUTANEOUS | Status: DC

## 2015-08-19 MED ORDER — DEXTROSE 5 % IV SOLN
2.0000 g | INTRAVENOUS | Status: DC
  Administered 2015-08-19 – 2015-08-21 (×3): 2 g via INTRAVENOUS
  Filled 2015-08-19 (×4): qty 2

## 2015-08-19 MED ORDER — SODIUM CHLORIDE 0.9 % IV BOLUS (SEPSIS)
1000.0000 mL | Freq: Once | INTRAVENOUS | Status: AC
  Administered 2015-08-19: 1000 mL via INTRAVENOUS

## 2015-08-19 MED ORDER — CARBAMAZEPINE 200 MG PO TABS
300.0000 mg | ORAL_TABLET | Freq: Every morning | ORAL | Status: DC
  Administered 2015-08-19 – 2015-08-22 (×6): 300 mg via ORAL
  Filled 2015-08-19: qty 1.5
  Filled 2015-08-19 (×2): qty 2

## 2015-08-19 MED ORDER — CARBAMAZEPINE 200 MG PO TABS
300.0000 mg | ORAL_TABLET | Freq: Every morning | ORAL | Status: DC
  Filled 2015-08-19: qty 1.5

## 2015-08-19 MED ORDER — SODIUM CHLORIDE 0.9 % IV SOLN
INTRAVENOUS | Status: DC
  Administered 2015-08-19 – 2015-08-21 (×5): via INTRAVENOUS

## 2015-08-19 MED ORDER — ONDANSETRON HCL 4 MG/2ML IJ SOLN: 4.0000 mg | Freq: Four times a day (QID) | INTRAMUSCULAR | Status: DC | PRN

## 2015-08-19 MED ORDER — ONDANSETRON HCL 4 MG PO TABS: 4.0000 mg | ORAL_TABLET | Freq: Four times a day (QID) | ORAL | Status: DC | PRN

## 2015-08-19 MED ORDER — BISACODYL 10 MG RE SUPP
10.0000 mg | Freq: Every day | RECTAL | Status: DC | PRN
  Administered 2015-08-20 – 2015-08-21 (×2): 10 mg via RECTAL
  Filled 2015-08-19 (×4): qty 1

## 2015-08-19 MED ORDER — CARBAMAZEPINE 200 MG PO TABS
ORAL_TABLET | ORAL | Status: AC
  Filled 2015-08-19: qty 1

## 2015-08-19 MED ORDER — CARBAMAZEPINE 200 MG PO TABS
300.0000 mg | ORAL_TABLET | Freq: Two times a day (BID) | ORAL | Status: DC
  Filled 2015-08-19: qty 2

## 2015-08-19 MED ORDER — ACETAMINOPHEN 325 MG PO TABS
650.0000 mg | ORAL_TABLET | Freq: Four times a day (QID) | ORAL | Status: DC | PRN
  Administered 2015-08-19 – 2015-08-22 (×8): 650 mg via ORAL
  Filled 2015-08-19 (×8): qty 2

## 2015-08-19 MED ORDER — DEXTROSE 5 % IV SOLN
2.0000 g | Freq: Once | INTRAVENOUS | Status: AC
  Administered 2015-08-19: 2 g via INTRAVENOUS
  Filled 2015-08-19: qty 2

## 2015-08-19 MED ORDER — CARBAMAZEPINE 200 MG PO TABS
400.0000 mg | ORAL_TABLET | Freq: Every day | ORAL | Status: DC
  Administered 2015-08-19 – 2015-08-20 (×2): 400 mg via ORAL
  Filled 2015-08-19: qty 2

## 2015-08-19 MED ORDER — SENNOSIDES-DOCUSATE SODIUM 8.6-50 MG PO TABS: 1.0000 | ORAL_TABLET | Freq: Every evening | ORAL | Status: DC | PRN

## 2015-08-19 MED ORDER — BISACODYL 5 MG PO TBEC
5.0000 mg | DELAYED_RELEASE_TABLET | Freq: Two times a day (BID) | ORAL | Status: DC
  Administered 2015-08-19 – 2015-08-22 (×5): 5 mg via ORAL
  Filled 2015-08-19 (×5): qty 1

## 2015-08-19 MED ORDER — SODIUM CHLORIDE 0.9 % IV BOLUS (SEPSIS): 1000.0000 mL | Freq: Once | INTRAVENOUS | Status: DC

## 2015-08-19 NOTE — ED Notes (Signed)
Mother states patient felt warm to the touch and continues to feel warm.

## 2015-08-19 NOTE — ED Notes (Signed)
Report called to Mary, RN.

## 2015-08-19 NOTE — ED Notes (Signed)
Pt from [facility]. Mother states pt had started having urinary frequency and c/o having to urinate over the night.  Pt's mother states he has a UTI once per year. Brought in urine sample from home that is dark amber.  Mother does not want patient out of the wheelchair.  States his mental status is normal for him even though he is lethargic. Pt is warm to the touch.

## 2015-08-19 NOTE — ED Notes (Addendum)
Pt's mom called this RN to bedside due to patient agitation. Pt noted to have short periods of agitation followed by periods of calm.

## 2015-08-19 NOTE — Progress Notes (Signed)
Pharmacy Antibiotic Note  Jacob HammanSteven G Green is a 50 y.o. male admitted on 08/19/2015 with urosepsis.  Pharmacy has been consulted for ceftriaxone dosing. Received cefepime 2gm x 1 in ED   Plan: Ceftriaxone 2gm IV Q24H to start 8 hours after cefepime dose  Weight: 130 lb (58.968 kg)  Temp (24hrs), Avg:98.6 F (37 C), Min:98.2 F (36.8 C), Max:98.9 F (37.2 C)   Recent Labs Lab 08/19/15 0915 08/19/15 1230  WBC 20.0*  --   CREATININE 0.65  --   LATICACIDVEN 1.6 2.3*    CrCl cannot be calculated (Unknown ideal weight.).   Est CrCl ~ 2594ml/min  No Known Allergies  Antimicrobials this admission: ceftriaxone 6/16 >>   Dose adjustments this admission:   Microbiology results: 6/16 BCx:  6/16 UCx:    Thank you for allowing pharmacy to be a part of this patient's care.  Jacob Green C 08/19/2015 2:10 PM

## 2015-08-19 NOTE — ED Provider Notes (Signed)
[facility] Emergency Department Provider Note  ____________________________________________  Time seen: Approximately 9:13 AM  I have reviewed the triage vital signs and the nursing notes.   HISTORY  Chief Complaint Altered Mental Status and Urinary Tract Infection  EM caveat: The patient has a history of major traumatic brain injury, left with some cognitive deficits  HPI Jacob Green is a 50 y.o. male presents for evaluation of fever and urinary frequency.  Patient's mom gives history. She reports that Jacob Green has been feeling rather weak yesterday, again urinating frequently, and that his urine has become dark consistent with previous "UTIs". Mom reports he has a history of many frequent urinary tract infections usually treated by his primary care, and she is slightly upset that the urgent care sent her here.   No fever. He is not complaining of any pain. He has no rashes. He felt very warm last night.  No trouble breathing.  Past Medical History  Diagnosis Date  . Closed TBI (traumatic brain injury) (HCC) approximately 40 yrs ago  . Glaucoma   . Urinary tract infection     There are no active problems to display for this patient.   History reviewed. No pertinent past surgical history.  Current Outpatient Rx  Name  Route  Sig  Dispense  Refill  . benzoyl peroxide-erythromycin (BENZAMYCIN) gel   Topical   Apply 1 application topically 2 (two) times daily.         . bisacodyl (STIMULANT LAXATIVE) 5 MG EC tablet   Oral   Take 5 mg by mouth 2 (two) times daily.         . carbamazepine (TEGRETOL) 200 MG tablet   Oral   Take 300-400 mg by mouth 2 (two) times daily. 300 mg every morning and 400 mg at bedtime         . desonide (DESOWEN) 0.05 % lotion   Topical   Apply 1 application topically 2 (two) times daily as needed (for irritation).          . Glycerin, Laxative, (RA GLYCERIN ADULT) 80.7 % SUPP   Rectal   Place 1 suppository  rectally as needed (for constipation).          Marland Kitchen. PARoxetine (PAXIL) 20 MG tablet   Oral   Take 20 mg by mouth 2 (two) times daily.           Allergies Review of patient's allergies indicates no known allergies.  History reviewed. No pertinent family history.  Social History Social History  Substance Use Topics  . Smoking status: Never Smoker   . Smokeless tobacco: None  . Alcohol Use: No    Review of Systems Constitutional: Felt warm at home Eyes: No visual changes. ENT: No sore throat. Cardiovascular: Denies chest pain. Respiratory: Denies shortness of breath. Gastrointestinal: No abdominal pain.  No nausea, no vomiting.  No diarrhea.  No constipation. Eating normally. Genitourinary: See history of present illness Musculoskeletal: Negative for back pain. Skin: Negative for rash. Neurological: Negative for headaches, focal weakness or numbness. He has chronic weakness in both legs, but is able to stand to pivot with his mother and transfer.  10-point ROS otherwise negative.  ____________________________________________   PHYSICAL EXAM:  VITAL SIGNS: ED Triage Vitals  Enc Vitals Group     BP 08/19/15 0843 91/65 mmHg     Pulse Rate 08/19/15 0843 109     Resp 08/19/15 0843 16     Temp 08/19/15 0847 98.9 F (37.2  C)     Temp Source 08/19/15 0847 Oral     SpO2 08/19/15 0843 96 %     Weight 08/19/15 0843 130 lb (58.968 kg)     Height --      Head Cir --      Peak Flow --      Pain Score --      Pain Loc --      Pain Edu? --      Excl. in GC? --    Constitutional: Somnolent, resting his head to the side of his shoulder seated in a wheelchair. Is able to stand with assistance of his mother and pivot to the bed. He is able to tell his name, says that he is "good" but maintains head at his side, seemingly very somnolent. Eyes: Conjunctivae are normal. PERRL. EOMI. Head: Atraumatic. Nose: No congestion/rhinnorhea. Mouth/Throat: Mucous membranes are dry.   Oropharynx non-erythematous. Neck: No stridor.  No meningismus Cardiovascular: Tachycardic rate, regular rhythm. Grossly normal heart sounds.  Good peripheral circulation. Respiratory: Normal respiratory effort.  No retractions. Lungs CTAB. Gastrointestinal: Soft and nontender. No distention. No abdominal bruits. No CVA tenderness. Adult diaper. Normal testicles/penis. Does not have an indwelling catheter. Musculoskeletal: No lower extremity tenderness nor edema.  No joint effusions. Neurologic: Soft-spoken. Keeps eyes closed, mother states due to previous injury he is not able to open his eyes but a sliver. No obvious focal deficit, somewhat atrophied in all extremities. He appears chronically ill, but no evidence of an obvious neurologic deficit. Skin:  Skin is warm, dry and intact. No rash noted. Psychiatric: Mood and affect are very calm, somewhat flat.  ____________________________________________   LABS (all labs ordered are listed, but only abnormal results are displayed)  Labs Reviewed  URINALYSIS COMPLETEWITH MICROSCOPIC ([facility] ONLY) - Abnormal; Notable for the following:    Color, Urine AMBER (*)    APPearance HAZY (*)    Specific Gravity, Urine 1.032 (*)    Hgb urine dipstick 1+ (*)    Protein, ur 100 (*)    Leukocytes, UA 2+ (*)    Bacteria, UA RARE (*)    Squamous Epithelial / LPF 0-5 (*)    All other components within normal limits  CBC WITH DIFFERENTIAL/PLATELET - Abnormal; Notable for the following:    WBC 20.0 (*)    Platelets 129 (*)    Neutro Abs 17.5 (*)    Lymphs Abs 0.6 (*)    Monocytes Absolute 2.0 (*)    All other components within normal limits  COMPREHENSIVE METABOLIC PANEL - Abnormal; Notable for the following:    Glucose, Bld 102 (*)    Calcium 8.7 (*)    All other components within normal limits  CULTURE, BLOOD (ROUTINE X 2)  CULTURE, BLOOD (ROUTINE X 2)  URINE CULTURE  LACTIC ACID, PLASMA  LIPASE, BLOOD  LACTIC ACID, PLASMA    ____________________________________________  EKG   ____________________________________________  RADIOLOGY  DG Chest Portable 1 View (Final result) Result time: 08/19/15 09:37:06   Final result by Rad Results In Interface (08/19/15 09:37:06)   Narrative:   CLINICAL DATA: Fevers  EXAM: PORTABLE CHEST 1 VIEW  COMPARISON: None.  FINDINGS: The heart size and mediastinal contours are within normal limits. Both lungs are clear. The visualized skeletal structures show scoliosis concave to the left  IMPRESSION: No active disease.   Electronically Signed By: Alcide Clever M.D. On: 08/19/2015 09:37    ____________________________________________   PROCEDURES  Procedure(s) performed: None  Critical Care performed: No  ____________________________________________   INITIAL IMPRESSION / ASSESSMENT AND PLAN / ED COURSE  Pertinent labs & imaging results that were available during my care of the patient were reviewed by me and considered in my medical decision making (see chart for details).    ----------------------------------------- 9:42 AM on 08/19/2015 -----------------------------------------  Patient with significant leukocytosis, mild tachycardia, and urinalysis that appears to be consistent with a urinary tract infection. The patient meets sepsis criteria," "sepsis" was called. Cefepime ordered based on Hospital recommendations for urinary/healthcare source.  ____________________________________________   FINAL CLINICAL IMPRESSION(S) / ED DIAGNOSES  Final diagnoses:  Urinary tract infection, acute  Sepsis, due to unspecified organism [facility])      Sharyn Creamer, MD 08/19/15 [phone]

## 2015-08-19 NOTE — Progress Notes (Signed)
Let Dr. Juliene PinaMody know of critical lactic acid of 2.3.  Blood cultures drawn and fluids running.
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
## 2015-08-19 NOTE — ED Notes (Signed)
Per patient's mom pt is at baseline and is able to take medications PO. This RN spoke with MD and per MD okay to use patient's home meds at this time due to not having brand names, pt's mom states "The generics don't work, it has to be brand". This RN spoke with pharmacy, per pharmacy Tegretol brand name that patient needed was not stocked in the pharmacy. Per MD okay for patient's mom to bring in home medications. Corrie DandyMary, RN and receiving nurse aware, Dr. Juliene PinaMody aware, and pt's mom states that she will bring in his home medications.

## 2015-08-20 LAB — BASIC METABOLIC PANEL
ANION GAP: 6 (ref 5–15)
BUN: 10 mg/dL (ref 6–20)
CALCIUM: 7.9 mg/dL — AB (ref 8.9–10.3)
CO2: 21 mmol/L — ABNORMAL LOW (ref 22–32)
Chloride: 110 mmol/L (ref 101–111)
Creatinine, Ser: 0.6 mg/dL — ABNORMAL LOW (ref 0.61–1.24)
GFR calc Af Amer: >60 mL/min (ref >60)
GLUCOSE: 111 mg/dL — AB (ref 65–99)
POTASSIUM: 3.4 mmol/L — AB (ref 3.5–5.1)
SODIUM: 137 mmol/L (ref 135–145)

## 2015-08-20 LAB — CBC
HEMATOCRIT: 36.3 % — AB (ref 40.0–52.0)
HEMOGLOBIN: 12.4 g/dL — AB (ref 13.0–18.0)
MCH: 29.6 pg (ref 26.0–34.0)
MCHC: 34.1 g/dL (ref 32.0–36.0)
MCV: 86.7 fL (ref 80.0–100.0)
Platelets: 113 10*3/uL — ABNORMAL LOW (ref 150–440)
RBC: 4.19 MIL/uL — ABNORMAL LOW (ref 4.40–5.90)
RDW: 13.6 % (ref 11.5–14.5)
WBC: 20.6 10*3/uL — AB (ref 3.8–10.6)

## 2015-08-20 LAB — LACTIC ACID, PLASMA
LACTIC ACID, VENOUS: 1.3 mmol/L (ref 0.5–2.0)
LACTIC ACID, VENOUS: 1.6 mmol/L (ref 0.5–2.0)

## 2015-08-20 MED ORDER — POTASSIUM CHLORIDE 20 MEQ PO PACK
40.0000 meq | PACK | Freq: Once | ORAL | Status: AC
  Administered 2015-08-20: 40 meq via ORAL
  Filled 2015-08-20: qty 2
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
## 2015-08-20 NOTE — Progress Notes (Signed)
[facility] - [place] at [facility]   PATIENT NAME: Carollee LeitzSteven Croy    MR#:  [id number]  DATE OF BIRTH:  08/01/1965  SUBJECTIVE:  CHIEF COMPLAINT:  Patient is resting comfortably. Talks some according to the mom at bedside.  REVIEW OF SYSTEMS:  Review of systems unobtainable secondary to  DRUG ALLERGIES:  No Known Allergies  VITALS:  Blood pressure 91/62, pulse 101, temperature 98.4 F (36.9 C), temperature source Oral, resp. rate 18, height 5\' 4"  (1.626 m), weight 69.219 kg (152 lb 9.6 oz), SpO2 98 %.  PHYSICAL EXAMINATION:  GENERAL:  50 y.o.-year-old patient lying in the bed with no acute distress.  EYES: Patient has third cranial nerve damage. Left pupil is fixed and dilated at his baseline NECK:  Supple, no jugular venous distention. No thyroid enlargement, no tenderness.  LUNGS: Normal breath sounds bilaterally, no wheezing, rales,rhonchi or crepitation. No use of accessory muscles of respiration.  CARDIOVASCULAR: S1, S2 normal. No murmurs, rubs, or gallops.  ABDOMEN: Soft, nontender, nondistended. Bowel sounds present. No organomegaly or mass.  EXTREMITIES: No pedal edema, cyanosis, or clubbing.  NEUROLOGIC: Right upper extremity contracture. In fetal position  PSYCHIATRIC: The patient is with chronic traumatic brain injury SKIN: No obvious rash, lesion, or ulcer.    LABORATORY PANEL:   CBC  Recent Labs Lab 08/20/15 0545  WBC 20.6*  HGB 12.4*  HCT 36.3*  PLT 113*   ------------------------------------------------------------------------------------------------------------------  Chemistries   Recent Labs Lab 08/19/15 0915 08/20/15 0545  NA 137 137  K 3.6 3.4*  CL 104 110  CO2 25 21*  GLUCOSE 102* 111*  BUN 15 10  CREATININE 0.65 0.60*  CALCIUM 8.7* 7.9*  AST 26  --   ALT 32  --   ALKPHOS 85  --   BILITOT 0.6  --     ------------------------------------------------------------------------------------------------------------------  Cardiac Enzymes No results for input(s): TROPONINI in the last 168 hours. ------------------------------------------------------------------------------------------------------------------  RADIOLOGY:  Dg Chest Portable 1 View  08/19/2015  CLINICAL DATA:  Fevers EXAM: PORTABLE CHEST 1 VIEW COMPARISON:  None. FINDINGS: The heart size and mediastinal contours are within normal limits. Both lungs are clear. The visualized skeletal structures show scoliosis concave to the left IMPRESSION: No active disease. Electronically Signed   By: Alcide CleverMark  Lukens M.D.   On: 08/19/2015 09:37    EKG:  No orders found for this or any previous visit.  ASSESSMENT AND PLAN:   50 year old male with TBI and history of seizures who presents with sepsis and UTI.  1. Sepsis: Patient presents with hypotension and leukocytosis at the time of admission Sepsis is due to urinary tract infection. Continue Rocephin and follow up on blood and urine cultures. Provide IV fluids at 100 mL an hour.  2. Urinary tract infection: Continue Rocephin  Urine culture and sensitivity with greater than 100,000 colonies of Escherichia coli, sensitivities pending  3. History of seizures: Continue Tegretol.  4. TBI: Patient has left fixed dilated pupil which is at baseline. He is able to transfer at baseline. He is mostly wheelchair bound.   5. Hypokalemia potassium at 3.4 replete and recheck in a.m.   Physical therapy and case management consult pending, assuming patient will have improvement in his mental status after IV antibiotics and IV fluids.       All the records are reviewed and case discussed with Care Management/Social Workerr. Management plans discussed with the patient's mo and they are in agreement.  CODE STATUS: fc  TOTAL TIME TAKING CARE OF  THIS PATIENT: 37 minutes.   POSSIBLE D/C IN 2  DAYS, DEPENDING ON CLINICAL CONDITION.  Note: This dictation was prepared with Dragon dictation along with smaller phrase technology. Any transcriptional errors that result from this process are unintentional.   Ramonita Lab M.D on 08/20/2015 at 12:28 PM  Between 7am to 6pm - Pager - [phone] After 6pm go to [email or web address] - password [email or web address] [facility]  [place] [facility]  Office  [phone]  CC: Primary care physician; [facility]

## 2015-08-21 LAB — BASIC METABOLIC PANEL
Anion gap: 5 (ref 5–15)
BUN: 7 mg/dL (ref 6–20)
CALCIUM: 7.7 mg/dL — AB (ref 8.9–10.3)
CHLORIDE: 110 mmol/L (ref 101–111)
CO2: 22 mmol/L (ref 22–32)
CREATININE: 0.39 mg/dL — AB (ref 0.61–1.24)
GFR calc Af Amer: >60 mL/min (ref >60)
GFR calc non Af Amer: >60 mL/min (ref >60)
Glucose, Bld: 101 mg/dL — ABNORMAL HIGH (ref 65–99)
Potassium: 3.5 mmol/L (ref 3.5–5.1)
SODIUM: 137 mmol/L (ref 135–145)

## 2015-08-21 LAB — CBC WITH DIFFERENTIAL/PLATELET
Basophils Absolute: 0 10*3/uL (ref 0–0.1)
EOS ABS: 0.1 10*3/uL (ref 0–0.7)
HCT: 32.2 % — ABNORMAL LOW (ref 40.0–52.0)
HEMOGLOBIN: 11.1 g/dL — AB (ref 13.0–18.0)
LYMPHS ABS: 0.9 10*3/uL — AB (ref 1.0–3.6)
Lymphocytes Relative: 6 %
MCH: 30 pg (ref 26.0–34.0)
MCHC: 34.3 g/dL (ref 32.0–36.0)
MCV: 87.5 fL (ref 80.0–100.0)
MONO ABS: 1.1 10*3/uL — AB (ref 0.2–1.0)
Neutro Abs: 12.1 10*3/uL — ABNORMAL HIGH (ref 1.4–6.5)
Platelets: 96 10*3/uL — ABNORMAL LOW (ref 150–440)
RBC: 3.68 MIL/uL — ABNORMAL LOW (ref 4.40–5.90)
RDW: 13.5 % (ref 11.5–14.5)
WBC: 14.2 10*3/uL — ABNORMAL HIGH (ref 3.8–10.6)

## 2015-08-21 LAB — URINE CULTURE: Special Requests: NORMAL

## 2015-08-21 NOTE — Progress Notes (Signed)
[facility] - [place] at [facility]   PATIENT NAME: Jacob Green    MR#:  [id number]  DATE OF BIRTH:  Mar 29, 1965  SUBJECTIVE:  CHIEF COMPLAINT:  Patient is resting comfortably. Talks some according to the mom at bedside.  REVIEW OF SYSTEMS:  Review of systems unobtainable secondary to  DRUG ALLERGIES:  No Known Allergies  VITALS:  Blood pressure 112/73, pulse 95, temperature 99.7 F (37.6 C), temperature source Oral, resp. rate 18, height  (1.626 m), weight 69.219 kg (152 lb 9.6 oz), SpO2 95 %.  PHYSICAL EXAMINATION:  GENERAL:  50 y.o.-year-old patient lying in the bed with no acute distress.  EYES: Patient has third cranial nerve damage. Left pupil is fixed and dilated at his baseline NECK:  Supple, no jugular venous distention. No thyroid enlargement, no tenderness.  LUNGS: Normal breath sounds bilaterally, no wheezing, rales,rhonchi or crepitation. No use of accessory muscles of respiration.  CARDIOVASCULAR: S1, S2 normal. No murmurs, rubs, or gallops.  ABDOMEN: Soft, nontender, nondistended. Bowel sounds present. No organomegaly or mass.  EXTREMITIES: No pedal edema, cyanosis, or clubbing.  NEUROLOGIC: Right upper extremity contracture. In fetal position  PSYCHIATRIC: The patient is with chronic traumatic brain injury SKIN: No obvious rash, lesion, or ulcer.    LABORATORY PANEL:   CBC  Recent Labs Lab 08/21/15 0554  WBC 14.2*  HGB 11.1*  HCT 32.2*  PLT 96*   ------------------------------------------------------------------------------------------------------------------  Chemistries   Recent Labs Lab 08/19/15 0915  08/21/15 0554  NA 137  < > 137  K 3.6  < > 3.5  CL 104  < > 110  CO2 25  < > 22  GLUCOSE 102*  < > 101*  BUN 15  < > 7  CREATININE 0.65  < > 0.39*  CALCIUM 8.7*  < > 7.7*  AST 26  --   --   ALT 32  --   --   ALKPHOS 85  --   --   BILITOT 0.6  --   --   < > = values in this interval not  displayed. ------------------------------------------------------------------------------------------------------------------  Cardiac Enzymes No results for input(s): TROPONINI in the last 168 hours. ------------------------------------------------------------------------------------------------------------------  RADIOLOGY:  No results found.  EKG:  No orders found for this or any previous visit.  ASSESSMENT AND PLAN:   50 year old male with TBI and history of seizures who presents with sepsis and UTI.  1. Sepsis: Patient presents with hypotension and leukocytosis at the time of admission Sepsis is due to urinary tract infection. Urine culture with greater than 100,000 colonies of Escherichia coli Continue Rocephin  follow up on pending blood cultures Provide IV fluids at 100 mL an hour.  2. Urinary tract infection: Continue Rocephin  Urine culture and sensitivity with greater than 100,000 colonies of Escherichia coli, sensitivities pending  3. History of seizures: Continue Tegretol.  4. TBI: Patient has left fixed dilated pupil which is at baseline. He is able to transfer at baseline. He is mostly wheelchair bound.   5. Hypokalemia potassium at 3.5, replete    Physical therapy -the PD follow-up as the patient is with TBI  and case management consult pending, assuming patient will have improvement in his mental status after IV antibiotics and IV fluids.       All the records are reviewed and case discussed with Care Management/Social Workerr. Management plans discussed with the patient's mo and they are in agreement.  CODE STATUS: fc  TOTAL TIME TAKING CARE OF THIS  PATIENT: 37 minutes.   POSSIBLE D/C IN a.m. DAYS, DEPENDING ON CLINICAL CONDITION.  Note: This dictation was prepared with Dragon dictation along with smaller phrase technology. Any transcriptional errors that result from this process are unintentional.   Ramonita LabGouru, Micaila Ziemba M.D on 08/21/2015 at 3:27  PM  Between 7am to 6pm - Pager - [phone] After 6pm go to [email or web address] - password [email or web address] [facility]  [place] [place] [facility]  Office  [phone]  CC: Primary care physician; [facility]

## 2015-08-22 LAB — CBC
HEMATOCRIT: 33.9 % — AB (ref 40.0–52.0)
HEMOGLOBIN: 11.5 g/dL — AB (ref 13.0–18.0)
MCH: 29.7 pg (ref 26.0–34.0)
MCHC: 33.9 g/dL (ref 32.0–36.0)
MCV: 87.7 fL (ref 80.0–100.0)
Platelets: 122 10*3/uL — ABNORMAL LOW (ref 150–440)
RBC: 3.86 MIL/uL — AB (ref 4.40–5.90)
RDW: 13.3 % (ref 11.5–14.5)
WBC: 5.2 10*3/uL (ref 3.8–10.6)

## 2015-08-22 MED ORDER — CEPHALEXIN 500 MG PO CAPS: 500.0000 mg | ORAL_CAPSULE | Freq: Two times a day (BID) | ORAL | Status: AC

## 2015-08-22 MED ORDER — SACCHAROMYCES BOULARDII 250 MG PO CAPS: 250.0000 mg | ORAL_CAPSULE | Freq: Two times a day (BID) | ORAL | Status: AC

## 2015-08-22 NOTE — Care Management (Addendum)
Admitted to [facility] with the diagnosis of sepsis (urinary tract infection). Lives with mother Elnita MaxwellCheryl [phone]). Last seen Dr. Terance HartBronstein a few months ago. [facility] [employer] [place] [facility] 5 [facility] a [facility]. [facility] provides a nursing assistant for 2.5 hours in the PM Monday - Friday. Did participate in outpatient physical therapy. No skilled facilities in the past. Needs helps with activities of daily living. Wheelchair, ramps, bed rails, and handicapped van at home. [facility]. [facility] [address]. They will deliver to the home. Mother will transport. Discharge to home today per Gouru Gwenette GreetBrenda S Conception Doebler RN MSN CCM Care Management [phone]

## 2015-08-22 NOTE — Discharge Instructions (Signed)
Activity as tolerated patient is mostly bedbound from TBI Diet-regular Follow-up with primary care physician in a week

## 2015-08-22 NOTE — Care Management Important Message (Signed)
Important Message  Patient Details  Name: Lavina HammanSteven G Helfand MRN: [id number] Date of Birth: 08/19/1965   Medicare Important Message Given:  Yes    Gwenette GreetBrenda S Kaeson Kleinert, RN 08/22/2015, 9:29 AM

## 2015-08-22 NOTE — Progress Notes (Signed)
Discharge instructions given and went over with mother and patient at bedside. Prescriptions given. All questions answered. Patient discharged home with mother. Bo McclintockBrewer,Theola Cuellar S, RN

## 2015-08-22 NOTE — Discharge Summary (Signed)
[facility] - [place] at [facility]   PATIENT NAME: Jacob Green    MR#:  [id number]  DATE OF BIRTH:  Jul 29, 1965  DATE OF ADMISSION:  08/19/2015 ADMITTING PHYSICIAN: Adrian Saran, MD  DATE OF DISCHARGE: 08/22/15 PRIMARY CARE PHYSICIAN: [facility]    ADMISSION DIAGNOSIS:  Urinary tract infection, acute [N39.0] Sepsis, due to unspecified organism (HCC) [A41.9]  DISCHARGE DIAGNOSIS:  Active Problems:   Sepsis (HCC) Escherichia coli UTI Chronic TBI  SECONDARY DIAGNOSIS:   Past Medical History  Diagnosis Date  . Closed TBI (traumatic brain injury) (HCC) approximately 40 yrs ago  . Glaucoma   . Urinary tract infection   . Seizures [facility])     HOSPITAL COURSE:   50 year old male with TBI and history of seizures who presents with sepsis and UTI.  1. Sepsis: Patient presents with hypotension and leukocytosis at the time of admission Sepsis is due to urinary tract infection. Urine culture with greater than 100,000 colonies of Escherichia coli  improved clinically with Rocephin , will d/c home with keflex follow up  blood cultures are negative Provided IV fluids at 100 mL an hour for hydration  2. Urinary tract infection: Continue Rocephin  Urine culture and sensitivity with greater than 100,000 colonies of Escherichia coli, discharge patient home with Keflex 500 mg by mouth every 12 hours  3. History of seizures: Continue Tegretol.  4. TBI: Patient has left fixed dilated pupil which is at baseline. He is able to transfer at baseline. He is mostly wheelchair bound.   5. Hypokalemia potassium at 3.5, repleted  DISCHARGE CONDITIONS:   Fair, back to baseline  CONSULTS OBTAINED:      PROCEDURES None  DRUG ALLERGIES:  No Known Allergies  DISCHARGE MEDICATIONS:   Current Discharge Medication List    START taking these medications   Details  cephALEXin (KEFLEX) 500 MG capsule Take 1 capsule (500 mg total) by mouth 2 (two)  times daily. Qty: 14 capsule, Refills: 0    saccharomyces boulardii (FLORASTOR) 250 MG capsule Take 1 capsule (250 mg total) by mouth 2 (two) times daily. Qty: 14 capsule, Refills: 0      CONTINUE these medications which have NOT CHANGED   Details  benzoyl peroxide-erythromycin (BENZAMYCIN) gel Apply 1 application topically 2 (two) times daily.    bisacodyl (STIMULANT LAXATIVE) 5 MG EC tablet Take 5 mg by mouth 2 (two) times daily.    carbamazepine (TEGRETOL) 200 MG tablet Take 300-400 mg by mouth 2 (two) times daily. 300 mg every morning and 400 mg at bedtime    desonide (DESOWEN) 0.05 % lotion Apply 1 application topically 2 (two) times daily as needed (for irritation).     Glycerin, Laxative, (RA GLYCERIN ADULT) 80.7 % SUPP Place 1 suppository rectally as needed (for constipation).     PARoxetine (PAXIL) 20 MG tablet Take 20 mg by mouth 2 (two) times daily.         DISCHARGE INSTRUCTIONS:   Activity as tolerated patient is mostly bedbound from TBI Diet-regular Follow-up with primary care physician in a week  DIET:  Regular diet  DISCHARGE CONDITION:  Fair  ACTIVITY:  Activity as tolerated  OXYGEN:  Home Oxygen: No.   Oxygen Delivery: room air  DISCHARGE LOCATION:  home   If you experience worsening of your admission symptoms, develop shortness of breath, life threatening emergency, suicidal or homicidal thoughts you must seek medical attention immediately by calling 911 or calling your MD immediately  if symptoms  less severe.  You Must read complete instructions/literature along with all the possible adverse reactions/side effects for all the Medicines you take and that have been prescribed to you. Take any new Medicines after you have completely understood and accpet all the possible adverse reactions/side effects.   Please note  You were cared for by a hospitalist during your hospital stay. If you have any questions about your discharge medications or the  care you received while you were in the hospital after you are discharged, you can call the unit and asked to speak with the hospitalist on call if the hospitalist that took care of you is not available. Once you are discharged, your primary care physician will handle any further medical issues. Please note that NO REFILLS for any discharge medications will be authorized once you are discharged, as it is imperative that you return to your primary care physician (or establish a relationship with a primary care physician if you do not have one) for your aftercare needs so that they can reassess your need for medications and monitor your lab values.     Today  Chief Complaint  Patient presents with  . Altered Mental Status  . Urinary Tract Infection   Patient is resting comfortably at his baseline per mom he has chronic TBI  ROS:  Unobtainable from TBI  VITAL SIGNS:  Blood pressure 126/85, pulse 81, temperature 97.7 F (36.5 C), temperature source Oral, resp. rate 18, height  (1.626 m), weight 69.219 kg (152 lb 9.6 oz), SpO2 94 %.  I/O:    Intake/Output Summary (Last 24 hours) at 08/22/15 1307 Last data filed at 08/22/15 0900  Gross per 24 hour  Intake 1286.67 ml  Output      0 ml  Net 1286.67 ml    PHYSICAL EXAMINATION:   GENERAL: 50 y.o.-year-old patient lying in the bed with no acute distress.  EYES: Patient has third cranial nerve damage. Left pupil is fixed and dilated at his baseline NECK: Supple, no jugular venous distention. No thyroid enlargement, no tenderness.  LUNGS: Normal breath sounds bilaterally, no wheezing, rales,rhonchi or crepitation. No use of accessory muscles of respiration.  CARDIOVASCULAR: S1, S2 normal. No murmurs, rubs, or gallops.  ABDOMEN: Soft, nontender, nondistended. Bowel sounds present. No organomegaly or mass.  EXTREMITIES: No pedal edema, cyanosis, or clubbing.  NEUROLOGIC: Right upper extremity contracture. In fetal position   PSYCHIATRIC: The patient is with chronic traumatic brain injury SKIN: No obvious rash, lesion, or ulcer.   DATA REVIEW:   CBC  Recent Labs Lab 08/22/15 1025  WBC 5.2  HGB 11.5*  HCT 33.9*  PLT 122*    Chemistries   Recent Labs Lab 08/19/15 0915  08/21/15 0554  NA 137  < > 137  K 3.6  < > 3.5  CL 104  < > 110  CO2 25  < > 22  GLUCOSE 102*  < > 101*  BUN 15  < > 7  CREATININE 0.65  < > 0.39*  CALCIUM 8.7*  < > 7.7*  AST 26  --   --   ALT 32  --   --   ALKPHOS 85  --   --   BILITOT 0.6  --   --   < > = values in this interval not displayed.  Cardiac Enzymes No results for input(s): TROPONINI in the last 168 hours.  Microbiology Results  Results for orders placed or performed during the hospital encounter of 08/19/15  Urine  culture     Status: Abnormal   Collection Time: 08/19/15  8:56 AM  Result Value Ref Range Status   Specimen Description URINE, CLEAN CATCH  Final   Special Requests Normal  Final   Culture >=100,000 COLONIES/mL ESCHERICHIA COLI (A)  Final   Report Status 08/21/2015 FINAL  Final   Organism ID, Bacteria ESCHERICHIA COLI (A)  Final      Susceptibility   Escherichia coli - MIC*    AMPICILLIN >=32 RESISTANT Resistant     CEFAZOLIN <=4 SENSITIVE Sensitive     CEFTRIAXONE <=1 SENSITIVE Sensitive     CIPROFLOXACIN <=0.25 SENSITIVE Sensitive     GENTAMICIN <=1 SENSITIVE Sensitive     IMIPENEM <=0.25 SENSITIVE Sensitive     NITROFURANTOIN <=16 SENSITIVE Sensitive     TRIMETH/SULFA <=20 SENSITIVE Sensitive     AMPICILLIN/SULBACTAM >=32 RESISTANT Resistant     PIP/TAZO <=4 SENSITIVE Sensitive     Extended ESBL NEGATIVE Sensitive     * >=100,000 COLONIES/mL ESCHERICHIA COLI  Culture, blood (Routine X 2) w Reflex to ID Panel     Status: None (Preliminary result)   Collection Time: 08/19/15  9:15 AM  Result Value Ref Range Status   Specimen Description BLOOD LEFT ANTECUBITAL  Final   Special Requests   Final    BOTTLES DRAWN AEROBIC AND  ANAEROBIC  AER 6CC ANA 8CC   Culture NO GROWTH 2 DAYS  Final   Report Status PENDING  Incomplete  Culture, blood (Routine X 2) w Reflex to ID Panel     Status: None (Preliminary result)   Collection Time: 08/19/15  9:15 AM  Result Value Ref Range Status   Specimen Description BLOOD LEFT FOREARM  Final   Special Requests BOTTLES DRAWN AEROBIC AND ANAEROBIC  5CC  Final   Culture NO GROWTH 2 DAYS  Final   Report Status PENDING  Incomplete    RADIOLOGY:  Dg Chest Portable 1 View  08/19/2015  CLINICAL DATA:  Fevers EXAM: PORTABLE CHEST 1 VIEW COMPARISON:  None. FINDINGS: The heart size and mediastinal contours are within normal limits. Both lungs are clear. The visualized skeletal structures show scoliosis concave to the left IMPRESSION: No active disease. Electronically Signed   By: Alcide CleverMark  Lukens M.D.   On: 08/19/2015 09:37    EKG:  No orders found for this or any previous visit.    Management plans discussed with the patient's mom and she is in agreement.  CODE STATUS:     Code Status Orders        Start     Ordered   08/19/15 1316  Full code   Continuous     08/19/15 1315    Code Status History    Date Active Date Inactive Code Status Order ID Comments User Context   This patient has a current code status but no historical code status.      TOTAL TIME TAKING CARE OF THIS PATIENT: 45 minutes.   Note: This dictation was prepared with Dragon dictation along with smaller phrase technology. Any transcriptional errors that result from this process are unintentional.   @[facility] @  on 08/22/2015 at 1:07 PM  Between 7am to 6pm - Pager - [phone]  After 6pm go to [email or web address] - password [email or web address] [facility]  [facility]  Office  [phone]  CC: Primary care physician; [facility]

## 2015-08-26 LAB — CULTURE, BLOOD (ROUTINE X 2)
Culture: NO GROWTH
Culture: NO GROWTH

## 2015-08-30 ENCOUNTER — Ambulatory Visit: Payer: Medicare Other | Admitting: Physical Therapy

## 2015-08-30 DIAGNOSIS — M436 Torticollis: Secondary | ICD-10-CM

## 2015-08-30 DIAGNOSIS — M6281 Muscle weakness (generalized): Secondary | ICD-10-CM | POA: Diagnosis present

## 2015-08-31 NOTE — Therapy (Signed)
[facility] [address] [place], [address] Phone: [phone]   Fax:  [phone]  Physical Therapy Treatment  Patient Details  Name: Jacob Green MRN: [id number] Date of Birth: 17-Apr-1965 No Data Recorded  Encounter Date: 08/30/2015      PT End of Session - 08/30/15 0912    Visit Number 19   Number of Visits 26   Date for PT Re-Evaluation 09/15/15   Authorization Type Gcode   Authorization - Visit Number 14   Authorization - Number of Visits 20   PT Start Time 0900   PT Stop Time 0940   PT Time Calculation (min) 40 min   Activity Tolerance Treatment limited secondary to agitation  initially pt tolerated well, incr. agitation 2/2 cavitation   Behavior During Therapy Agitated      Past Medical History  Diagnosis Date  . Closed TBI (traumatic brain injury) ([place]) approximately 40 yrs ago  . Glaucoma   . Urinary tract infection   . Seizures ([place])     No past surgical history on file.  There were no vitals filed for this visit.      Subjective Assessment - 08/30/15 0908    Subjective Pt and mother present for session. Reports pt was admitted to hospital for 4 days due to a UTI. His UTI was considered "significant" but he was treated and responded well to treatment. Reports he has had notably less energy since that time.   Patient is accompained by: Family member   Pertinent History See subjective   Patient Stated Goals Mother would like to be able to feed pt, wash pt, and shaving/brushing teeth.   Currently in Pain? No/denies               Objective: Extensive practice for cervical volitional stretching, extension with retraction, lateral flexion, rotation. Utilized verbal cuing, pt positioning to make interesting objects orientable, manual cuing and tapping. Pt able to achieve -10 deg. To neutral lateral flexion, still lacked extension even with practice.  Passive stretching for  cervical positioning with C-R, facilitation techniques. Pt's mother reported during session that she had to do some stretching with pt over the past week and it has re-injured her thumbs so she had to stop, pt has no other family member to assist with this so extensive stretching performed. Following this performed one additional volitional stretch to neutral.                       PT Long Term Goals - 08/30/15 0934    PT LONG TERM GOAL #1   Title Pt will demonstrate improved upright posture in cervical spine when eating based on Pt's mother's report as well as decr. redness on L side of mouth.   Time 12   Period Weeks   Status Achieved   PT LONG TERM GOAL #2   Title Pt will improve cervical lateral flexion to 15 degrees R lateral flexion indicating improvement in motion to decr. strain on caregivers when assisting with feeding and bathing tasks.   Baseline Pt has lost all motion beyond midline.   Time 12   Period Weeks   Status Achieved   PT LONG TERM GOAL #3   Title Pt will demonstrate decr. "droop" to 2-3 x per week to decr. limitation in visual field.   Time 12   Period Weeks   Status Partially Met  Plan - 08/30/15 0917    Clinical Impression Statement Pt has made progress with upright posture, able to self achieve -10 deg. to neutral cervical lateral flexion. Pt does continue to have incr. cervical flexion which appears to be worsening. Overall posture is better today than it has ever been. He does continue to have two areas of redness in L cervical region, one is nearly gone but still litly present, second is still significant.   Rehab Potential Fair   Clinical Impairments Affecting Rehab Potential TBI, family support   PT Frequency 1x / week   PT Duration 12 weeks   PT Treatment/Interventions Manual techniques;Wheelchair mobility training;Therapeutic activities;ADLs/Self Care Home Management;Functional mobility training   Consulted and Agree  with Plan of Care Patient;Family member/caregiver      Patient will benefit from skilled therapeutic intervention in order to improve the following deficits and impairments:  Decreased range of motion, Impaired perceived functional ability, Improper body mechanics, Decreased strength, Decreased mobility  Visit Diagnosis: Stiffness of cervical spine     Problem List Patient Active Problem List   Diagnosis Date Noted  . Sepsis ([place]) 08/19/2015    Darice Vicario PT DPT 08/31/2015, 7:01 AM  [facility] [address] [place], [address] Phone: [phone]   Fax:  [phone]  Name: Jacob Green MRN: [id number] Date of Birth: 11-Jan-1966

## 2015-09-12 ENCOUNTER — Ambulatory Visit: Payer: Medicare Other | Admitting: Physical Therapy

## 2015-09-27 ENCOUNTER — Ambulatory Visit: Payer: Medicare Other | Attending: Neurology | Admitting: Physical Therapy

## 2015-09-27 DIAGNOSIS — M436 Torticollis: Secondary | ICD-10-CM

## 2015-09-27 NOTE — Therapy (Signed)
[facility] [address], [place], [address] Phone: [phone]   Fax:  [phone]  Physical Therapy Treatment  Patient Details  Name: TALI COSTER MRN: [id number] Date of Birth: 1965/03/30 No Data Recorded  Encounter Date: 09/27/2015      PT End of Session - 09/27/15 0908    Visit Number 20   Number of Visits 26   Date for PT Re-Evaluation 01/17/16   Authorization Type Gcode   Authorization - Visit Number 20   Authorization - Number of Visits 30   PT Start Time 0900   PT Stop Time 0940   PT Time Calculation (min) 40 min   Activity Tolerance --  initially pt tolerated well, incr. agitation 2/2 cavitation      Past Medical History:  Diagnosis Date  . Closed TBI (traumatic brain injury) ([place]) approximately 40 yrs ago  . Glaucoma   . Seizures ([place])   . Urinary tract infection     No past surgical history on file.  There were no vitals filed for this visit.      Subjective Assessment - 09/27/15 0904    Subjective Pt and mother present for session. Reports he had to miss his previous session due to a severe cold and then a second UTI. Was given antibiotic for this which he was on for 10 days.  This ended 10 days ago.    Patient is accompained by: Family member   Pertinent History See subjective   Patient Stated Goals Mother would like to be able to feed pt, wash pt, and shaving/brushing teeth.   Currently in Pain? No/denies              Objective: Extensive STM performed on B paraspinals, L UT, LS.  Inferior glides of L scapula x5 min total with notable release of periscapular musculature following this.  Cervical stretching for lateral flexion, R rotation.  Pt tolerated this well with no agitation.  Elbow stretching to address significant contracture. Following 4 min of this pt indicated to discontinue this.  Pt required PT skills to identify regions requiring soft tissue work  and to stretch in a non-agitating way.                   PT Education - 09/27/15 0907    Education provided Yes   Education Details Educated on monitoring for UTI from a rehab protocol.   Person(s) Educated Patient;Parent(s)   Methods Explanation   Comprehension Verbalized understanding             PT Long Term Goals - 09/27/15 0939      PT LONG TERM GOAL #1   Title Pt will demonstrate improved upright posture in cervical spine when eating based on Pt's mother's report as well as decr. redness on L side of mouth.   Time 12   Period Weeks   Status Achieved     PT LONG TERM GOAL #2   Title Pt will improve cervical lateral flexion to 15 degrees R lateral flexion indicating improvement in motion to decr. strain on caregivers when assisting with feeding and bathing tasks.   Baseline Pt has lost all motion beyond midline.   Time 12   Period Weeks   Status Achieved     PT LONG TERM GOAL #3   Title Pt will demonstrate decr. "droop" to 2-3 x per week to decr. limitation in visual field.   Time 12  Period Weeks   Status Partially Met               Plan - 2015/10/25 0908    Clinical Impression Statement Pt at rest has decr. postural uprightness. is generally able to correct with addition of considerable soft tissue work. Pt continues to have moderate to light redness in two creases in L side of neck and some difficulty with upright posture with eating which periodically leads to an ulcer in L side of mouth/face. Would benefit from continued stretching and STM to be performed on cervical spine to maintain maximal available ROM/posture for ADLs, due to inablity for caregiver to perform without self injury (irritated wrist)   Rehab Potential Fair   Clinical Impairments Affecting Rehab Potential TBI, family support   PT Frequency 1x / week   PT Duration 12 weeks   PT Treatment/Interventions Manual techniques;Wheelchair mobility training;Therapeutic  activities;ADLs/Self Care Home Management;Functional mobility training   Consulted and Agree with Plan of Care Patient;Family member/caregiver      Patient will benefit from skilled therapeutic intervention in order to improve the following deficits and impairments:  Decreased range of motion, Impaired perceived functional ability, Improper body mechanics, Decreased strength, Decreased mobility  Visit Diagnosis: Stiffness of cervical spine       G-Codes - 10-25-15 0940    Functional Assessment Tool Used Posture, ADL participation (feeding)   Functional Limitation Changing and maintaining body position   Changing and Maintaining Body Position Current Status (H7414) At least 1 percent but less than 20 percent impaired, limited or restricted   Changing and Maintaining Body Position Goal Status (E3953) At least 1 percent but less than 20 percent impaired, limited or restricted      Problem List Patient Active Problem List   Diagnosis Date Noted  . Sepsis ([place]) 08/19/2015    Fisher,Benjamin PT DPT 2015-10-25, 9:41 AM  [facility] [address] [place], [address] Phone: [phone]   Fax:  [phone]  Name: ERIEL DOYON MRN: [id number] Date of Birth: [birth year]

## 2015-10-11 ENCOUNTER — Ambulatory Visit: Payer: Medicare Other | Attending: Neurology | Admitting: Physical Therapy

## 2015-10-11 DIAGNOSIS — M6281 Muscle weakness (generalized): Secondary | ICD-10-CM | POA: Insufficient documentation

## 2015-10-11 DIAGNOSIS — M436 Torticollis: Secondary | ICD-10-CM | POA: Diagnosis present

## 2015-10-12 NOTE — Therapy (Signed)
[facility] [address] [place], [address] Phone: [phone]   Fax:  [phone]  Physical Therapy Treatment  Patient Details  Name: Jacob Green MRN: [id number] Date of Birth: [birth year] No Data Recorded  Encounter Date: 10/11/2015      PT End of Session - 10/11/15 8546    Visit Number 21   Number of Visits 26   Date for PT Re-Evaluation 01/17/16   Authorization Type Gcode   Authorization - Visit Number 20   Authorization - Number of Visits 30   PT Start Time 0910   PT Stop Time 0940   PT Time Calculation (min) 30 min   Activity Tolerance --  initially pt tolerated well, incr. agitation 2/2 cavitation      Past Medical History:  Diagnosis Date  . Closed TBI (traumatic brain injury) ([place]) approximately 40 yrs ago  . Glaucoma   . Seizures ([place])   . Urinary tract infection     No past surgical history on file.  There were no vitals filed for this visit.      Subjective Assessment - 10/11/15 0926    Subjective Pt and mother present for session, reports he is doing well, incr. verbalizations, no new UTI.   Patient is accompained by: Family member   Pertinent History See subjective   Patient Stated Goals Mother would like to be able to feed pt, wash pt, and shaving/brushing teeth.             Objective: STM performed on L paraspinals, UT, LS. Noted significant active tone in this region resisting light stretch required for performance, able to modify with cuing but indicative of potential active component to pt posture.  CPAs and L UPAs grade I 2x1 min C6-T2.  Stretch into R lateral flexion following this improved from neutral to 20 deg. R lateral flexion. Pt indicated via hand signal to end session followign this.                         PT Long Term Goals - 09/27/15 0939      PT LONG TERM GOAL #1   Title Pt will demonstrate improved upright posture in  cervical spine when eating based on Pt's mother's report as well as decr. redness on L side of mouth.   Time 12   Period Weeks   Status Achieved     PT LONG TERM GOAL #2   Title Pt will improve cervical lateral flexion to 15 degrees R lateral flexion indicating improvement in motion to decr. strain on caregivers when assisting with feeding and bathing tasks.   Baseline Pt has lost all motion beyond midline.   Time 12   Period Weeks   Status Achieved     PT LONG TERM GOAL #3   Title Pt will demonstrate decr. "droop" to 2-3 x per week to decr. limitation in visual field.   Time 12   Period Weeks   Status Partially Met               Plan - 10/11/15 0856    Clinical Impression Statement definite improvement noted in ROM, pt more engaged verbally throughout session and better able to follow commands. Did note with manual intervention incr. active tone in L cervical spine. will look to address this at next session with oscillations   Rehab Potential Fair   Clinical Impairments Affecting Rehab Potential TBI, family  support   PT Frequency 1x / week   PT Duration 12 weeks   PT Treatment/Interventions Manual techniques;Wheelchair mobility training;Therapeutic activities;ADLs/Self Care Home Management;Functional mobility training   Consulted and Agree with Plan of Care Patient;Family member/caregiver      Patient will benefit from skilled therapeutic intervention in order to improve the following deficits and impairments:  Decreased range of motion, Impaired perceived functional ability, Improper body mechanics, Decreased strength, Decreased mobility  Visit Diagnosis: Stiffness of cervical spine     Problem List Patient Active Problem List   Diagnosis Date Noted  . Sepsis ([place]) 08/19/2015    Fisher,Benjamin PT DPT 10/12/2015, 8:58 AM  [facility] [address], [place], [address] Phone: [phone]    Fax:  [phone]  Name: Jacob Green MRN: [id number] Date of Birth: 1965-07-09

## 2015-10-25 ENCOUNTER — Encounter: Payer: Medicare Other | Admitting: Physical Therapy

## 2015-10-26 ENCOUNTER — Ambulatory Visit: Payer: Medicare Other | Admitting: Physical Therapy

## 2015-10-26 DIAGNOSIS — M436 Torticollis: Secondary | ICD-10-CM | POA: Diagnosis not present

## 2015-10-26 DIAGNOSIS — M6281 Muscle weakness (generalized): Secondary | ICD-10-CM

## 2015-10-26 NOTE — Therapy (Signed)
[facility] [address], [place], [address] Phone: [phone]   Fax:  [phone]  Physical Therapy Treatment  Patient Details  Name: Jacob Green MRN: [id number] Date of Birth: Sep 21, 1965 No Data Recorded  Encounter Date: 10/26/2015      PT End of Session - 10/26/15 0819    Visit Number 22   Number of Visits 26   Date for PT Re-Evaluation 01/17/16   Authorization Type Gcode   Authorization - Visit Number 20   Authorization - Number of Visits 30   PT Start Time [phone]   PT Stop Time 0840   PT Time Calculation (min) 24 min   Activity Tolerance Treatment limited secondary to agitation  initially pt tolerated well, incr. agitation 2/2 cavitation   Behavior During Therapy Agitated      Past Medical History:  Diagnosis Date  . Closed TBI (traumatic brain injury) (HCC) approximately 40 yrs ago  . Glaucoma   . Seizures (HCC)   . Urinary tract infection     No past surgical history on file.  There were no vitals filed for this visit.      Subjective Assessment - 10/26/15 0818    Subjective Pt and mother present for session. Pt has higher level of resting tone per pt's mother.   Patient is accompained by: Family member   Pertinent History See subjective   Patient Stated Goals Mother would like to be able to feed pt, wash pt, and shaving/brushing teeth.   Currently in Pain? No/denies               Objective: Manually resisted cervical lateral flexion, rotation, extension, flexion. Pt required extensive cuing from PT and pt's mother for performance but generally able to perform well with greater than thready resistance for all except R lateral flexion was thready and weak.  STM performed on L paraspinals, UT, LS.  Following this manual holds in non-end range positions for cervical spine including upright/neutra., upright with rotation to the R, extension with overpressure on mid thoracic  spine.  Pt became agitated with this so ended session early.                       PT Long Term Goals - 09/27/15 0939      PT LONG TERM GOAL #1   Title Pt will demonstrate improved upright posture in cervical spine when eating based on Pt's mother's report as well as decr. redness on L side of mouth.   Time 12   Period Weeks   Status Achieved     PT LONG TERM GOAL #2   Title Pt will improve cervical lateral flexion to 15 degrees R lateral flexion indicating improvement in motion to decr. strain on caregivers when assisting with feeding and bathing tasks.   Baseline Pt has lost all motion beyond midline.   Time 12   Period Weeks   Status Achieved     PT LONG TERM GOAL #3   Title Pt will demonstrate decr. "droop" to 2-3 x per week to decr. limitation in visual field.   Time 12   Period Weeks   Status Partially Met               Plan - 10/26/15 0826    Clinical Impression Statement Noted trigger points in supraspinatus. pt has improved active cervical control, resting posture is unchanged. Pt tolerated incr. exercise today. did eventually demonstrate  some incr. agitation following manual intervention which required ending session early.   Rehab Potential Fair   Clinical Impairments Affecting Rehab Potential TBI, family support   PT Frequency 1x / week   PT Duration 12 weeks   PT Treatment/Interventions Manual techniques;Wheelchair mobility training;Therapeutic activities;ADLs/Self Care Home Management;Functional mobility training   Consulted and Agree with Plan of Care Patient;Family member/caregiver      Patient will benefit from skilled therapeutic intervention in order to improve the following deficits and impairments:  Decreased range of motion, Impaired perceived functional ability, Improper body mechanics, Decreased strength, Decreased mobility  Visit Diagnosis: Stiffness of cervical spine  Muscle weakness     Problem List Patient Active  Problem List   Diagnosis Date Noted  . Sepsis ([place]) 08/19/2015    Demontre Padin PT DPT 10/26/2015, 8:48 AM  [place] [address] [place], [address] Phone: [phone]   Fax:  [phone]  Name: Jacob Green MRN: [id number] Date of Birth: [birth year]

## 2015-11-08 ENCOUNTER — Ambulatory Visit: Payer: Medicare Other | Attending: Neurology | Admitting: Physical Therapy

## 2015-11-08 DIAGNOSIS — M436 Torticollis: Secondary | ICD-10-CM | POA: Diagnosis not present

## 2015-11-08 DIAGNOSIS — M6281 Muscle weakness (generalized): Secondary | ICD-10-CM | POA: Diagnosis present

## 2015-11-09 NOTE — Therapy (Signed)
[facility] [address] [place], [address] Phone: [phone]   Fax:  [phone]  Physical Therapy Treatment  Patient Details  Name: Jacob Green MRN: [id number] Date of Birth: 02/17/1966 No Data Recorded  Encounter Date: 11/08/2015      PT End of Session - 11/08/15 0830    Visit Number 23   Number of Visits 26   Date for PT Re-Evaluation 01/17/16   Authorization Type Gcode   Authorization - Visit Number 20   Authorization - Number of Visits 30   PT Start Time [phone]   PT Stop Time 0845   PT Time Calculation (min) 28 min   Activity Tolerance Treatment limited secondary to agitation  initially pt tolerated well, incr. agitation 2/2 cavitation   Behavior During Therapy Agitated      Past Medical History:  Diagnosis Date  . Closed TBI (traumatic brain injury) ([place]) approximately 40 yrs ago  . Glaucoma   . Seizures ([place])   . Urinary tract infection     No past surgical history on file.  There were no vitals filed for this visit.      Subjective Assessment - 11/08/15 0823    Subjective Pt and mother present for session. Noted incr. sleepiness/fatigue today.   Patient is accompained by: Family member   Pertinent History See subjective   Patient Stated Goals Mother would like to be able to feed pt, wash pt, and shaving/brushing teeth.   Currently in Pain? No/denies           Objective: STM performed on L UT, LS, gentle effleurage and compression.  Following this performed stretching to neutral, pt did not tolerate incr. Stretching from here. Noted clicking in jaw so with pt permission performed STM on this region   Instructed pt's mother on neutral wrist (10 deg. Extension) stretch with hand on temporalis region to re-attempt for pt's mother to be able to facilitate stretching at home.  Pt only able to to achieve neutral cervical spine, minimal signs of agitation within  session.                      PT Education - 11/08/15 0824    Education provided Yes   Education Details new handhold for pt's mother performing self stretch.   Person(s) Educated Patient   Methods Explanation   Comprehension Verbalized understanding             PT Long Term Goals - 09/27/15 0939      PT LONG TERM GOAL #1   Title Pt will demonstrate improved upright posture in cervical spine when eating based on Pt's mother's report as well as decr. redness on L side of mouth.   Time 12   Period Weeks   Status Achieved     PT LONG TERM GOAL #2   Title Pt will improve cervical lateral flexion to 15 degrees R lateral flexion indicating improvement in motion to decr. strain on caregivers when assisting with feeding and bathing tasks.   Baseline Pt has lost all motion beyond midline.   Time 12   Period Weeks   Status Achieved     PT LONG TERM GOAL #3   Title Pt will demonstrate decr. "droop" to 2-3 x per week to decr. limitation in visual field.   Time 12   Period Weeks   Status Partially Met  Plan - 11/08/15 0831    Clinical Impression Statement Noted decr. stiffness in shoulder and neck with masseter STM.    Rehab Potential Fair   Clinical Impairments Affecting Rehab Potential TBI, family support   PT Frequency 1x / week   PT Duration 12 weeks   PT Treatment/Interventions Manual techniques;Wheelchair mobility training;Therapeutic activities;ADLs/Self Care Home Management;Functional mobility training   Consulted and Agree with Plan of Care Patient;Family member/caregiver      Patient will benefit from skilled therapeutic intervention in order to improve the following deficits and impairments:  Decreased range of motion, Impaired perceived functional ability, Improper body mechanics, Decreased strength, Decreased mobility, Prosthetic Dependency  Visit Diagnosis: Stiffness of cervical spine     Problem List Patient Active  Problem List   Diagnosis Date Noted  . Sepsis ([place]) 08/19/2015    [name] PT DPT 11/09/2015, 8:07 AM  [facility] [address], [place], [address] Phone: [phone]   Fax:  [phone]  Name: Jacob Green MRN: [id number] Date of Birth: [birth year]

## 2015-11-22 ENCOUNTER — Ambulatory Visit: Payer: Medicare Other | Admitting: Physical Therapy

## 2015-11-22 DIAGNOSIS — M436 Torticollis: Secondary | ICD-10-CM | POA: Diagnosis not present

## 2015-11-22 DIAGNOSIS — M6281 Muscle weakness (generalized): Secondary | ICD-10-CM

## 2015-11-22 NOTE — Therapy (Signed)
[facility] [address], [place], [address] Phone: [phone]   Fax:  [phone]  Physical Therapy Treatment  Patient Details  Name: Jacob Green MRN: [id number] Date of Birth: 1965/11/26 No Data Recorded  Encounter Date: 11/22/2015      PT End of Session - 11/22/15 1404    Visit Number 24   Number of Visits 26   Date for PT Re-Evaluation 01/17/16   Authorization Type Gcode   Authorization - Visit Number 20   Authorization - Number of Visits 30   PT Start Time 0915   PT Stop Time 0940   PT Time Calculation (min) 25 min   Activity Tolerance Treatment limited secondary to agitation  initially pt tolerated well, incr. agitation 2/2 cavitation   Behavior During Therapy Agitated      Past Medical History:  Diagnosis Date  . Closed TBI (traumatic brain injury) ([place]) approximately 40 yrs ago  . Glaucoma   . Seizures ([place])   . Urinary tract infection     No past surgical history on file.  There were no vitals filed for this visit.      Subjective Assessment - 11/22/15 0917    Subjective Pt and mother present for session. Pt's mother noticed that shaving was somewhat more difficult today as pt had difficulty straightening neck volitionally.   Patient is accompained by: Family member   Pertinent History See subjective   Patient Stated Goals Mother would like to be able to feed pt, wash pt, and shaving/brushing teeth.               Objective: Isometric strengthening for neck in multiple positions, requiring extensive verbal and tactile cuing. Focusing on lateral flexion, flexion, extension, rotation.  AROM in multiple positions using single UE for support and cuing (resistance) R lateral flexion x4 min, R rotation x4 min, extension x4 min total practice.  Following this pt had improvement in resting posture to -10 deg.  Required extensive verbal and tactile cuing for performance of  this. Noted improved tolerance for this activity compred with previous sessions with no agitation.                  PT Education - 11/22/15 [phone]    Education provided Yes   Education Details resisted HEP   Person(s) Educated Patient   Methods Explanation   Comprehension Verbalized understanding             PT Long Term Goals - 11/22/15 1410      PT LONG TERM GOAL #1   Title Pt will demonstrate improved upright posture in cervical spine when eating based on Pt's mother's report as well as decr. redness on L side of mouth.   Time 12   Period Weeks   Status Achieved     PT LONG TERM GOAL #2   Title Pt will improve cervical lateral flexion to 15 degrees R lateral flexion indicating improvement in motion to decr. strain on caregivers when assisting with feeding and bathing tasks.   Baseline Pt has lost all motion beyond midline.   Time 12   Period Weeks   Status Achieved     PT LONG TERM GOAL #3   Title Pt will demonstrate decr. "droop" to 2-3 x per week to decr. limitation in visual field.   Time 12   Period Weeks   Status Partially Met  Plan - 11/22/15 1404    Clinical Impression Statement improved ability to participate in muscle control exericses using reciprocal inhibition, MET, facilitative resistance. Pt had incr. redness in L cervical region where he has been holding his head for the past few days per mother's information.   Rehab Potential Fair   Clinical Impairments Affecting Rehab Potential TBI, family support   PT Frequency 1x / week   PT Duration 12 weeks   PT Treatment/Interventions Manual techniques;Wheelchair mobility training;Therapeutic activities;ADLs/Self Care Home Management;Functional mobility training   Consulted and Agree with Plan of Care Patient;Family member/caregiver      Patient will benefit from skilled therapeutic intervention in order to improve the following deficits and impairments:  Decreased range of  motion, Impaired perceived functional ability, Improper body mechanics, Decreased strength, Decreased mobility, Prosthetic Dependency  Visit Diagnosis: Muscle weakness (generalized)     Problem List Patient Active Problem List   Diagnosis Date Noted  . Sepsis ([place]) 08/19/2015    Jacob Green 11/22/2015, 2:12 PM  [facility] [address] [place], [address] Phone: [phone]   Fax:  [phone]  Name: Jacob Green MRN: [id number] Date of Birth: 1965/03/29

## 2015-12-06 ENCOUNTER — Ambulatory Visit: Payer: Medicare Other | Attending: Neurology | Admitting: Physical Therapy

## 2015-12-06 DIAGNOSIS — M6281 Muscle weakness (generalized): Secondary | ICD-10-CM | POA: Diagnosis not present

## 2015-12-06 DIAGNOSIS — M436 Torticollis: Secondary | ICD-10-CM | POA: Diagnosis present

## 2015-12-06 NOTE — Therapy (Signed)
[facility] [address] [place], [address] Phone: [phone]   Fax:  [phone]  Physical Therapy Treatment  Patient Details  Name: Jacob Green MRN: [id number] Date of Birth: [birth year] No Data Recorded  Encounter Date: 12/06/2015      PT End of Session - 12/06/15 0921    Visit Number 25   Number of Visits 26   Date for PT Re-Evaluation 01/17/16   Authorization Type Gcode   Authorization - Visit Number 20   Authorization - Number of Visits 30   PT Start Time 0905   PT Stop Time 0943   PT Time Calculation (min) 38 min   Activity Tolerance Treatment limited secondary to agitation  initially pt tolerated well, incr. agitation 2/2 cavitation   Behavior During Therapy Agitated      Past Medical History:  Diagnosis Date  . Closed TBI (traumatic brain injury) ([place]) approximately 40 yrs ago  . Glaucoma   . Seizures ([place])   . Urinary tract infection     No past surgical history on file.  There were no vitals filed for this visit.      Subjective Assessment - 12/06/15 0906    Subjective Pt and mother present for session. Pt's mother reports no change other than he is easier to get in position for shaving. Also reports modification of w/c.   Patient is accompained by: Family member   Pertinent History See subjective   Patient Stated Goals Mother would like to be able to feed pt, wash pt, and shaving/brushing teeth.                 objective: Pt had difficulty tolerating session today due to incr. Muscle activation.   Extensive STM performed on L paraspinals, CPAs grade III 3x1 min T1-T5.  Attempted AROM stretching however pt had difficulty folowing cuing so deferred additional PT at this time due to this. Will reassess pt at next session for possible decr. In frequency of visits.                       PT Long Term Goals - 11/22/15 1410      PT LONG TERM GOAL  #1   Title Pt will demonstrate improved upright posture in cervical spine when eating based on Pt's mother's report as well as decr. redness on L side of mouth.   Time 12   Period Weeks   Status Achieved     PT LONG TERM GOAL #2   Title Pt will improve cervical lateral flexion to 15 degrees R lateral flexion indicating improvement in motion to decr. strain on caregivers when assisting with feeding and bathing tasks.   Baseline Pt has lost all motion beyond midline.   Time 12   Period Weeks   Status Achieved     PT LONG TERM GOAL #3   Title Pt will demonstrate decr. "droop" to 2-3 x per week to decr. limitation in visual field.   Time 12   Period Weeks   Status Partially Met               Plan - 12/06/15 2248    Clinical Impression Statement PT discussed with pt's mother reassessment at next session and looking to drop down to once per month to assess for possibility of longer maintenance intervals. Pt continues to have improved muscle control and decr. resistance to upright posture.   Rehab Potential  Fair   Clinical Impairments Affecting Rehab Potential TBI, family support   PT Frequency 1x / week   PT Duration 12 weeks   PT Treatment/Interventions Manual techniques;Wheelchair mobility training;Therapeutic activities;ADLs/Self Care Home Management;Functional mobility training   Consulted and Agree with Plan of Care Patient;Family member/caregiver      Patient will benefit from skilled therapeutic intervention in order to improve the following deficits and impairments:  Decreased range of motion, Impaired perceived functional ability, Improper body mechanics, Decreased strength, Decreased mobility, Prosthetic Dependency  Visit Diagnosis: Muscle weakness (generalized)  Stiffness of cervical spine     Problem List Patient Active Problem List   Diagnosis Date Noted  . Sepsis ([place]) 08/19/2015    Japleen Tornow 12/06/2015, 9:37 AM  [facility] [address], [place], [address] Phone: [phone]   Fax:  [phone]  Name: IVAR DOMANGUE MRN: [id number] Date of Birth: [birth year]

## 2015-12-20 ENCOUNTER — Encounter: Payer: Medicare Other | Admitting: Physical Therapy

## 2015-12-23 ENCOUNTER — Ambulatory Visit: Payer: Medicare Other | Admitting: Physical Therapy

## 2015-12-23 DIAGNOSIS — M6281 Muscle weakness (generalized): Secondary | ICD-10-CM | POA: Diagnosis not present

## 2015-12-23 DIAGNOSIS — M436 Torticollis: Secondary | ICD-10-CM

## 2015-12-23 NOTE — Therapy (Signed)
[facility] [address], [place], [address] Phone: [phone]   Fax:  [phone]  Physical Therapy Treatment  Patient Details  Name: NAEL PETROSYAN MRN: [id number] Date of Birth: 11-11-1965 No Data Recorded  Encounter Date: 12/23/2015      PT End of Session - 12/23/15 0954    Visit Number 26   Number of Visits 26   Date for PT Re-Evaluation 01/17/16   Authorization Type Gcode   Authorization - Visit Number 20   Authorization - Number of Visits 30   PT Start Time [phone]   PT Stop Time 1015   PT Time Calculation (min) 33 min   Activity Tolerance Treatment limited secondary to agitation  initially pt tolerated well, incr. agitation 2/2 cavitation   Behavior During Therapy Agitated      Past Medical History:  Diagnosis Date  . Closed TBI (traumatic brain injury) ([place]) approximately 40 yrs ago  . Glaucoma   . Seizures ([place])   . Urinary tract infection     No past surgical history on file.  There were no vitals filed for this visit.      Subjective Assessment - 12/23/15 0943    Subjective Pt and mother present for session. Reports she is late because he has a Air traffic controller" w/c and is having trouble with transfers in and out as well as positioning.   Patient is accompained by: Family member   Pertinent History See subjective   Patient Stated Goals Mother would like to be able to feed pt, wash pt, and shaving/brushing teeth.   Currently in Pain? No/denies                    Objective: Reassessed pt, instructed pt's mother on handhold for stretching, how to monitor for signs of skin breakdown in region of cervical spine, facial regions (eyes and mouth).  Performed manual stretching into end range for cervical lateral flexion and rotation, multiple bouts with sustained holds. Improved from -30 deg. From neutral lateral flexion to 10 deg.   Pt's mother demonstrated appropriate handholds  for stretching and strategy for decr. Tone in cervical extensors.             PT Education - 12/23/15 0947    Education provided Yes   Education Details d/c instructions   Person(s) Educated Patient   Methods Explanation   Comprehension Verbalized understanding             PT Long Term Goals - 12/23/15 1050      PT LONG TERM GOAL #1   Title Pt will demonstrate improved upright posture in cervical spine when eating based on Pt's mother's report as well as decr. redness on L side of mouth.   Time 12   Period Weeks   Status Achieved     PT LONG TERM GOAL #2   Title Pt will improve cervical lateral flexion to 15 degrees R lateral flexion indicating improvement in motion to decr. strain on caregivers when assisting with feeding and bathing tasks.   Baseline Pt has lost all motion beyond midline.   Time 12   Period Weeks   Status Achieved     PT LONG TERM GOAL #3   Title Pt will demonstrate decr. "droop" to 2-3 x per week to decr. limitation in visual field.   Time 12   Period Weeks   Status Not Met  Plan - January 19, 2016 0958    Clinical Impression Statement Pt is appropriate for d/c at this time to monitor for return of worsening symptoms. Extensive instruction in monitoring for return of excess stiffness or incr. redness in cervical spine/ulceration near L lateral mouth.   Rehab Potential Fair   Clinical Impairments Affecting Rehab Potential TBI, family support   PT Frequency 1x / week   PT Duration 12 weeks   PT Treatment/Interventions Manual techniques;Wheelchair mobility training;Therapeutic activities;ADLs/Self Care Home Management;Functional mobility training   Consulted and Agree with Plan of Care Patient;Family member/caregiver      Patient will benefit from skilled therapeutic intervention in order to improve the following deficits and impairments:  Decreased range of motion, Impaired perceived functional ability, Improper body mechanics,  Decreased strength, Decreased mobility, Prosthetic Dependency  Visit Diagnosis: Stiffness of cervical spine       G-Codes - 01-19-16 1050    Functional Assessment Tool Used Posture, ADL participation (feeding)   Functional Limitation Changing and maintaining body position   Changing and Maintaining Body Position Discharge Status (N0148) At least 1 percent but less than 20 percent impaired, limited or restricted      Problem List Patient Active Problem List   Diagnosis Date Noted  . Sepsis ([place]) 08/19/2015    Antanisha Mohs PT DPT 01-19-2016, 10:51 AM  [facility] [address], [place], [address] Phone: [phone]   Fax:  [phone]  Name: STACE PEACE MRN: [id number] Date of Birth: May 15, 1965

## 2016-01-02 ENCOUNTER — Ambulatory Visit: Payer: Medicare Other | Admitting: Physical Therapy

## 2016-01-17 ENCOUNTER — Encounter: Payer: Medicare Other | Admitting: Physical Therapy

## 2016-01-31 ENCOUNTER — Encounter: Payer: Medicare Other | Admitting: Physical Therapy

## 2016-02-14 ENCOUNTER — Encounter: Payer: Medicare Other | Admitting: Physical Therapy

## 2016-02-22 ENCOUNTER — Ambulatory Visit: Payer: Medicare Other | Attending: Neurology | Admitting: Physical Therapy

## 2016-02-28 ENCOUNTER — Encounter: Payer: Medicare Other | Admitting: Physical Therapy

## 2016-03-01 ENCOUNTER — Encounter: Payer: Medicare Other | Admitting: Physical Therapy

## 2016-03-13 ENCOUNTER — Ambulatory Visit: Payer: Medicare Other | Attending: Neurology | Admitting: Physical Therapy

## 2016-03-13 DIAGNOSIS — M436 Torticollis: Secondary | ICD-10-CM | POA: Diagnosis not present

## 2016-03-13 DIAGNOSIS — M6281 Muscle weakness (generalized): Secondary | ICD-10-CM | POA: Insufficient documentation

## 2016-03-14 NOTE — Therapy (Signed)
[place] [facility]INE [address] [place], [address] Phone: [phone]   Fax:  [phone]  Physical Therapy Evaluation  Patient Details  Name: Jacob Green MRN: [id number] Date of Birth: [birth year] Referring Provider: Malvin Johns  Encounter Date: 03/13/2016      PT End of Session - 03/14/16 0649    Visit Number 1   Number of Visits 17   Date for PT Re-Evaluation 07/04/16   Authorization Type g code   PT Start Time 0900   PT Stop Time 0950   PT Time Calculation (min) 50 min   Activity Tolerance Patient tolerated treatment well   Behavior During Therapy [facility] for tasks assessed/performed      Past Medical History:  Diagnosis Date  . Closed TBI (traumatic brain injury) (HCC) approximately 40 yrs ago  . Glaucoma   . Seizures (HCC)   . Urinary tract infection     No past surgical history on file.  There were no vitals filed for this visit.       Subjective Assessment - 03/13/16 0910    Subjective Pt and mother present for session. Pt's mother reports he is moving around the house more, is more independent. She is somewhat concerned as he was playing at the stove. He is no more I with HEP/stretching than previous sessions. Pt's mother reports pt has significantly lateral shift in neck which causes difficulty for ADLs such as toothbrushing, shaving, as well as having some skin breakdown around his mouth and redness around his cervical region.   Patient is accompained by: Family member   Pertinent History TBI, chronic dystonia.   Currently in Pain? No/denies            [facility] PT Assessment - 03/13/16 0001      Assessment   Medical Diagnosis cervical dystonia   Referring Provider Potter   Prior Therapy yes     Precautions   Precautions None     Balance Screen   Has the patient fallen in the past 6 months No   Has the patient had a decrease in activity level because of a fear of falling?  No   Is the  patient reluctant to leave their home because of a fear of falling?  No     Prior Function   Level of Independence Needs assistance with ADLs     Observation/Other Assessments   Observations pt has area of redness in L mouth region, noted area of redness around neck.     ROM / Strength   AROM / PROM / Strength AROM;PROM;Strength     AROM   Overall AROM  Deficits   Overall AROM Comments inconsistent volitional control, currently to approximately -30 deg. lateral flexion to R, 15 deg. rotation in either direction     PROM   Overall PROM  Deficits   Overall PROM Comments unable to achieve neutral cervical position. -20 deg. best passive position, 20 deg. rotation B.      Strength   Overall Strength Comments unable to resist possibly due to volitional control     Palpation   Spinal mobility hypomobility with CPAs and UPAs T1-T6             Objective: STM performed on B UT, LS, peri-scapular musculature x15 min.  Sustained stretching for lateral flexion, rotation, retraction. Each performed to pt's tolerance avoiding agitation. Able to improve rotation to R to 50 deg. From 25, though inconsistent due to  notable tone. Throughout monitored pt for resistance and tone, utilized facilitative technique to encourage pt to participate in therapy and not to resist motion. Pt tolerated session well.               PT Education - 03/13/16 [phone]    Education provided Yes   Education Details Course of PT   Person(s) Educated Patient   Methods Explanation   Comprehension Verbalized understanding             PT Long Term Goals - 01-Apr-2016 1914      PT LONG TERM GOAL #1   Title Pt will demonstrate improved cervical posture as seen by decr. in line of redness in cervical region from 8" to less than 4" to decr. risk for skin breakdown   Time 12   Period Weeks   Status New     PT LONG TERM GOAL #2   Title Pt will be able to achieve 10 deg. cervical lateral flexion to R  indicating improvement in motion to decr. strain on caregivers with ADLs   Baseline able to achieve -5 deg. to neutral   Time 12   Period Weeks   Status New               Plan - April 01, 2016 0650    Clinical Impression Statement Pt has been seen in the past for continued stiffness in cervical spine region. Pt has TBI and spends most of his time in w/c, due to poor volitional neck control and eyesight challenges pt has significantly L laterally flexed position for cervical spine, currently it is moderately reversible so not a contracture but when pt is not seen for PT to address this his posture worsens and his caregivers have incr. difficulty with ADLs such as feeding, shaving and cleaning. Pt has somewhat worsened area of redness in L neck where his skin folds on itself, at L mouth crease likely due to postural difficulties. Pt's mother has attempted to address this on her own however when she performs stretches on pt she has had hand pain significant enough to requiire treatment. Pt would benefit from maintenance PT to avoid contracture, decr. risk of skin breakdown and to improve ADL assistance/performance.   Rehab Potential Fair   Clinical Impairments Affecting Rehab Potential TBI, family support   PT Frequency 1x / week   PT Duration 12 weeks   PT Treatment/Interventions Manual techniques;Wheelchair mobility training;Therapeutic activities;ADLs/Self Care Home Management;Functional mobility training;Therapeutic exercise   Consulted and Agree with Plan of Care Patient;Family member/caregiver      Patient will benefit from skilled therapeutic intervention in order to improve the following deficits and impairments:  Decreased range of motion, Impaired perceived functional ability, Impaired UE functional use, Improper body mechanics  Visit Diagnosis: Stiffness of cervical spine - Plan: PT plan of care cert/re-cert  Muscle weakness (generalized) - Plan: PT plan of care cert/re-cert       G-Codes - 01-Apr-2016 7829    Functional Assessment Tool Used Posture, ADL participation (feeding)   Functional Limitation Changing and maintaining body position   Changing and Maintaining Body Position Current Status (F6213) At least 1 percent but less than 20 percent impaired, limited or restricted   Changing and Maintaining Body Position Goal Status (Y8657) At least 1 percent but less than 20 percent impaired, limited or restricted       Problem List Patient Active Problem List   Diagnosis Date Noted  . Sepsis (HCC) 08/19/2015    Fisher,Benjamin  PT DPT 03/14/2016, 6:59 AM  [place] [facility] [address] [place], [place], [address] Phone: [phone]   Fax:  [phone]  Name: Jacob Green MRN: [id number] Date of Birth: 07/03/1965

## 2016-03-28 ENCOUNTER — Ambulatory Visit: Payer: Medicare Other | Admitting: Physical Therapy

## 2016-03-28 DIAGNOSIS — M436 Torticollis: Secondary | ICD-10-CM

## 2016-03-29 NOTE — Therapy (Signed)
[place] [facility] [address] , [place], [address] Phone: [phone]   Fax:  [phone]  Physical Therapy Treatment  Patient Details  Name: Jacob Green MRN: [id number] Date of Birth: 05/14/1965 Referring Provider: Malvin JohnsPotter  Encounter Date: 03/28/2016      PT End of Session - 03/28/16 0926    Visit Number 2   Number of Visits 17   Date for PT Re-Evaluation 07/04/16   Authorization Type g code   PT Start Time 0922   PT Stop Time 1000   PT Time Calculation (min) 38 min   Activity Tolerance Patient tolerated treatment well   Behavior [facility] Therapy [facility] for tasks assessed/performed      Past Medical History:  Diagnosis Date  . Closed TBI (traumatic brain injury) (HCC) approximately 40 yrs ago  . Glaucoma   . Seizures (HCC)   . Urinary tract infection     No past surgical history on file.  There were no vitals filed for this visit.      Subjective Assessment - 03/28/16 0925    Subjective pt's mother reports he is continuing to be more verbal. no change in neck position noted.   Patient is accompained by: Family member   Pertinent History TBI, chronic dystonia.         Objective: STM performed on SCM, levator, upper trap, infraspinatus. Noted incr. Tone in L side which improved with STM.  CPAs grade II T1-T4 x8 min total. Pt demonstrated improved ROM from -20 lat flex to -10.  Following this performed extensive stretching into R lat flex, utilized rhythmic inhibition which improved tone and tolerance for stretch.                               PT Long Term Goals - 03/14/16 [id number]      PT LONG TERM GOAL #1   Title Pt will demonstrate improved cervical posture as seen by decr. in line of redness in cervical region from 8" to less than 4" to decr. risk for skin breakdown   Time 12   Period Weeks   Status New     PT LONG TERM GOAL #2   Title Pt will be able to achieve  10 deg. cervical lateral flexion to R indicating improvement in motion to decr. strain on caregivers with ADLs   Baseline able to achieve -5 deg. to neutral   Time 12   Period Weeks   Status New               Plan - 03/28/16 1043    Clinical Impression Statement Pt responded well to session, note no change in self selected cervical posture. noted incr. tone in SCM which responded well to tx. following tx ROM improved considerably.    Rehab Potential Fair   Clinical Impairments Affecting Rehab Potential TBI, family support   PT Frequency 1x / week   PT Duration 12 weeks   PT Treatment/Interventions Manual techniques;Wheelchair mobility training;Therapeutic activities;ADLs/Self Care Home Management;Functional mobility training;Therapeutic exercise   Consulted and Agree with Plan of Care Patient;Family member/caregiver      Patient will benefit from skilled therapeutic intervention in order to improve the following deficits and impairments:  Decreased range of motion, Impaired perceived functional ability, Impaired UE functional use, Improper body mechanics  Visit Diagnosis: Stiffness of cervical spine     Problem List Patient Active Problem List  Diagnosis Date Noted  . Sepsis (HCC) 08/19/2015    Fisher,Benjamin PT DPT 03/29/2016, 11:22 AM  [place] [facility] PHYSICAL AND SPORTS MEDICINE [address], [place], [address] Phone: [phone]   Fax:  [phone]  Name: Jacob Green MRN: [id number] Date of Birth: 05-03-1965

## 2016-04-10 ENCOUNTER — Ambulatory Visit: Payer: Medicare Other | Attending: Neurology | Admitting: Physical Therapy

## 2016-04-10 DIAGNOSIS — M436 Torticollis: Secondary | ICD-10-CM | POA: Diagnosis not present

## 2016-04-10 DIAGNOSIS — M6281 Muscle weakness (generalized): Secondary | ICD-10-CM | POA: Diagnosis present

## 2016-04-10 NOTE — Therapy (Signed)
[place] [facility] [address] [place], [place], [address] Phone: [phone]   Fax:  [phone]  Physical Therapy Treatment  Patient Details  Name: Jacob Green MRN: [id number] Date of Birth: 03/21/1965 Referring Provider: Malvin JohnsPotter  Encounter Date: 04/10/2016      PT End of Session - 04/10/16 0950    Visit Number 3   Number of Visits 17   Date for PT Re-Evaluation 07/04/16   Authorization Type g code   PT Start Time 0908   PT Stop Time 0948   PT Time Calculation (min) 40 min   Activity Tolerance Patient tolerated treatment well   Behavior During Therapy [facility] for tasks assessed/performed      Past Medical History:  Diagnosis Date  . Closed TBI (traumatic brain injury) (HCC) approximately 40 yrs ago  . Glaucoma   . Seizures (HCC)   . Urinary tract infection     No past surgical history on file.  There were no vitals filed for this visit.      Subjective Assessment - 04/10/16 0953    Subjective Pt's mother present with pt during appointment. Pt's mother reports pt continues to be more verbal and talkative. Pt's mother states that they will discontinue Botox injections as they appear to make pt have greater difficulty with upright cervical posture.    Patient is accompained by: Family member   Pertinent History TBI, chronic dystonia.   Patient Stated Goals Mother would like to be able to feed pt, wash pt, and shaving/brushing teeth.   Currently in Pain? No/denies   Multiple Pain Sites No       Objective:  STM performed on L UT, LS, thoracic paraspinals.   Cervical PROM towards R lateral flexion and R rotation, with extensive slow and oscillatory stretching, able to achieve beyond neutral cervical lateral flexion.  Performed some stretches as sustained stretches in addition to oscillatory, utilized pressure on shoulder girdle to facilitate decr. Tone in cervicothoracic region.   PT instructed and had  mother perform STM on L levator, initially pt's mother used thumb for this, however due to previous history of thumb pain requiring medical tx discontinued and taught several modifications to minimize this. Will look to use massage tool at next session to improve home performance of activities.  STM to L levator scapula with cervical rotation. Pt appeared very relaxed throughout session and tolerated working through range past neutral.                           PT Education - 04/10/16 1014    Education provided Yes   Education Details Course   Person(s) Educated Patient;Parent(s)   Methods Explanation   Comprehension Verbalized understanding             PT Long Term Goals - 03/14/16 0655      PT LONG TERM GOAL #1   Title Pt will demonstrate improved cervical posture as seen by decr. in line of redness in cervical region from 8" to less than 4" to decr. risk for skin breakdown   Time 12   Period Weeks   Status New     PT LONG TERM GOAL #2   Title Pt will be able to achieve 10 deg. cervical lateral flexion to R indicating improvement in motion to decr. strain on caregivers with ADLs   Baseline able to achieve -5 deg. to neutral   Time 12  Period Weeks   Status New               Plan - 04/10/16 1017    Clinical Impression Statement Pt able to achieve cervical ROM past neutral. Continued area of rubor around L lateral cervical region with no improvement, note some improvement in buccal redness. Pt presented with breathing from chest, notable expansion in chest with inspiration, no diaphragmatic breathing observed. Pt took 5 deep breaths, may be indicative of reduced breathing capacity. Pt crossed arms in front of chest several times and appeared very relaxed throughout session.    Rehab Potential Fair   Clinical Impairments Affecting Rehab Potential TBI, family support   PT Frequency 1x / week   PT Duration 12 weeks   PT Treatment/Interventions  Manual techniques;Wheelchair mobility training;Therapeutic activities;ADLs/Self Care Home Management;Functional mobility training;Therapeutic exercise   Consulted and Agree with Plan of Care Patient;Family member/caregiver      Patient will benefit from skilled therapeutic intervention in order to improve the following deficits and impairments:  Decreased range of motion, Impaired perceived functional ability, Impaired UE functional use, Improper body mechanics  Visit Diagnosis: Stiffness of cervical spine  Muscle weakness (generalized)     Problem List Patient Active Problem List   Diagnosis Date Noted  . Sepsis (HCC) 08/19/2015   Renford Dills SPT Yuriko Portales PT DPT 04/10/2016, 10:30 AM  [place] [facility] PHYSICAL AND SPORTS MEDICINE [address] [place], [address] Phone: [phone]   Fax:  [phone]  Name: Jacob Green MRN: [id number] Date of Birth: Oct 15, 1965

## 2016-04-24 ENCOUNTER — Ambulatory Visit: Payer: Medicare Other | Admitting: Physical Therapy

## 2016-05-08 ENCOUNTER — Ambulatory Visit: Payer: Medicare Other | Attending: Neurology | Admitting: Physical Therapy

## 2016-05-08 DIAGNOSIS — M6281 Muscle weakness (generalized): Secondary | ICD-10-CM | POA: Insufficient documentation

## 2016-05-08 DIAGNOSIS — M436 Torticollis: Secondary | ICD-10-CM | POA: Diagnosis present

## 2016-05-08 NOTE — Therapy (Signed)
[place] [facility] MEDIC[facility] [address] [place], [place], [address] Phone: [phone]   Fax:  [phone]  Physical Therapy Treatment  Patient Details  Name: Jacob HammanSteven G Green MRN: [id number] Date of Birth: 12/27/1965 Referring Provider: Malvin JohnsPotter  Encounter Date: 05/08/2016      PT End of Session - 05/08/16 1214    Visit Number 4   Number of Visits 17   Date for PT Re-Evaluation 07/04/16   Authorization Type g code   Authorization Time Period 4   PT Start Time 0912   PT Stop Time 0945   PT Time Calculation (min) 33 min   Activity Tolerance Patient tolerated treatment well   Behavior During Therapy [facility] for tasks assessed/performed      Past Medical History:  Diagnosis Date  . Closed TBI (traumatic brain injury) (HCC) approximately 40 yrs ago  . Glaucoma   . Seizures (HCC)   . Urinary tract infection     No past surgical history on file.  There were no vitals filed for this visit.      Subjective Assessment - 05/08/16 0911    Subjective Pt and mother both had the flu two weeks ago. They have recovered well, reports no other issues at this time.   Patient is accompained by: Family member   Pertinent History TBI, chronic dystonia.   Patient Stated Goals Mother would like to be able to feed pt, wash pt, and shaving/brushing teeth.   Currently in Pain? No/denies           Objective: Seated STM performed on levator, UT, 1st rib region. Following this performed oscillatory stretches into end range lateral flexion, rotation, combined lateral flexion with rotation. Noted at end of session only able to achieve to neutral lateral flexion indicating some loss of ROM within session. Pt tolerated session well.                           PT Long Term Goals - 03/14/16 [id number]      PT LONG TERM GOAL #1   Title Pt will demonstrate improved cervical posture as seen by decr. in line of redness in cervical region  from 8" to less than 4" to decr. risk for skin breakdown   Time 12   Period Weeks   Status New     PT LONG TERM GOAL #2   Title Pt will be able to achieve 10 deg. cervical lateral flexion to R indicating improvement in motion to decr. strain on caregivers with ADLs   Baseline able to achieve -5 deg. to neutral   Time 12   Period Weeks   Status New               Plan - 05/08/16 1218    Clinical Impression Statement Pt continues to require extensive soft tissue work prior to stretching. Note continued tone and resistance initially which improved with stretching and oscillatory techniques.   Rehab Potential Fair   Clinical Impairments Affecting Rehab Potential TBI, family support   PT Frequency 1x / week   PT Duration 12 weeks   PT Treatment/Interventions Manual techniques;Wheelchair mobility training;Therapeutic activities;ADLs/Self Care Home Management;Functional mobility training;Therapeutic exercise   Consulted and Agree with Plan of Care Patient;Family member/caregiver      Patient will benefit from skilled therapeutic intervention in order to improve the following deficits and impairments:  Decreased range of motion, Impaired perceived functional ability, Impaired UE  functional use, Improper body mechanics  Visit Diagnosis: Stiffness of cervical spine     Problem List Patient Active Problem List   Diagnosis Date Noted  . Sepsis (HCC) 08/19/2015    Jacob Green,Jacob Green 05/08/2016, 12:24 PM  [place] [facility]TS MEDICINE [address] [place], [address] Phone: [phone]   Fax:  [phone]  Name: Jacob Green MRN: [id number] Date of Birth: March 11, 1965

## 2016-05-22 ENCOUNTER — Encounter: Payer: Medicare Other | Admitting: Physical Therapy

## 2016-05-24 ENCOUNTER — Ambulatory Visit: Payer: Medicare Other

## 2016-05-24 DIAGNOSIS — M6281 Muscle weakness (generalized): Secondary | ICD-10-CM

## 2016-05-24 DIAGNOSIS — M436 Torticollis: Secondary | ICD-10-CM

## 2016-05-24 NOTE — Therapy (Signed)
[place] [facility] [address] [place], [place], [address] Phone: [phone]   Fax:  [phone]  Physical Therapy Treatment  Patient Details  Name: Jacob Green MRN: [id number] Date of Birth: 11/15/1965 Referring Provider: Malvin JohnsPotter  Encounter Date: 05/24/2016      PT End of Session - 05/24/16 1251    Visit Number 5   Number of Visits 17   Date for PT Re-Evaluation 07/04/16   Authorization Type g code   Authorization Time Period 5   PT Start Time 1030   PT Stop Time 1119   PT Time Calculation (min) 49 min   Activity Tolerance Patient tolerated treatment well   Behavior During Therapy [facility] for tasks assessed/performed      Past Medical History:  Diagnosis Date  . Closed TBI (traumatic brain injury) (HCC) approximately 40 yrs ago  . Glaucoma   . Seizures (HCC)   . Urinary tract infection     History reviewed. No pertinent surgical history.  There were no vitals filed for this visit.      Subjective Assessment - 05/24/16 1249    Subjective Patient and mother report they've been working to keep the pt's head straight in sitting when watching TV. Mother reports she instructs people to be on the right side of the patient when talking.    Patient is accompained by: Family member   Pertinent History TBI, chronic dystonia.   Patient Stated Goals Mother would like to be able to feed pt, wash pt, and shaving/brushing teeth.   Currently in Pain? No/denies       TREATMENT: Manual therapy: Performed STM along left paraspinal, upper traps, cervical extensors, levator scap, and periscapular muscular with the patient in sitting. Mobilizations to T1-4 grade II to decrease pain. Performed prolonged stretching to decrease tone of cervical rotation to the right and cervical lateral flexion to the right. Performed tactile cueing to patient's head to direct patient action.  Patient able to achieve neutral head positioning after cueing  and manual stretching and soft tissue         PT Education - 05/24/16 1251    Education provided Yes   Education Details HEP: of straight out neck at home   Person(s) Educated Patient;Parent(s)   Methods Explanation;Demonstration   Comprehension Verbalized understanding;Returned demonstration             PT Long Term Goals - 03/14/16 0655      PT LONG TERM GOAL #1   Title Pt will demonstrate improved cervical posture as seen by decr. in line of redness in cervical region from 8" to less than 4" to decr. risk for skin breakdown   Time 12   Period Weeks   Status New     PT LONG TERM GOAL #2   Title Pt will be able to achieve 10 deg. cervical lateral flexion to R indicating improvement in motion to decr. strain on caregivers with ADLs   Baseline able to achieve -5 deg. to neutral   Time 12   Period Weeks   Status New               Plan - 05/24/16 1252    Clinical Impression Statement Continued to focus on improving cervical positioning and STM to decrease cervical tone. Patient demonstrates increased tone throughout upper trap, perispinal/scapular muscular, and levator scap. Patient will benefit from further stretching to acheive more neutral head positioning and improved cervical strength.    Rehab  Potential Fair   Clinical Impairments Affecting Rehab Potential TBI, family support   PT Frequency 1x / week   PT Duration 12 weeks   PT Treatment/Interventions Manual techniques;Wheelchair mobility training;Therapeutic activities;ADLs/Self Care Home Management;Functional mobility training;Therapeutic exercise   Consulted and Agree with Plan of Care Patient;Family member/caregiver      Patient will benefit from skilled therapeutic intervention in order to improve the following deficits and impairments:  Decreased range of motion, Impaired perceived functional ability, Impaired UE functional use, Improper body mechanics  Visit Diagnosis: Stiffness of cervical  spine  Muscle weakness (generalized)  Muscle weakness     Problem List Patient Active Problem List   Diagnosis Date Noted  . Sepsis (HCC) 08/19/2015    Myrene Galas, PT DPT 05/24/2016, 12:56 PM  [place] [facility] SERVICES [address] [place], [place], [address] Phone: [phone]   Fax:  [phone]  Name: Jacob Green MRN: [id number] Date of Birth: 1965-04-17

## 2016-06-06 ENCOUNTER — Ambulatory Visit: Payer: Medicare Other | Attending: Neurology

## 2016-06-06 DIAGNOSIS — M6281 Muscle weakness (generalized): Secondary | ICD-10-CM | POA: Insufficient documentation

## 2016-06-06 DIAGNOSIS — M436 Torticollis: Secondary | ICD-10-CM | POA: Diagnosis not present

## 2016-06-06 NOTE — Therapy (Signed)
[place] [facility] MAIN [facility] SERVICES [address] [place], [place], [address] Phone: [phone]   Fax:  [phone]  Physical Therapy Treatment  Patient Details  Name: Jacob Green MRN: [id number] Date of Birth: 1965/04/18 Referring Provider: Malvin Johns  Encounter Date: 06/06/2016      PT End of Session - 06/06/16 1042    Visit Number 6   Number of Visits 17   Date for PT Re-Evaluation 07/04/16   Authorization Type g code   Authorization Time Period 6   PT Start Time [phone]   PT Stop Time 1015   PT Time Calculation (min) 33 min   Activity Tolerance Patient tolerated treatment well   Behavior During Therapy [facility] for tasks assessed/performed      Past Medical History:  Diagnosis Date  . Closed TBI (traumatic brain injury) (HCC) approximately 40 yrs ago  . Glaucoma   . Seizures (HCC)   . Urinary tract infection     No past surgical history on file.  There were no vitals filed for this visit.      Subjective Assessment - 06/06/16 1035    Subjective Patient's mother reports the pt has been working on keep his head up straight and rotating to the right side. Patient's mother says he's been advancing and getting better the head straight    Patient is accompained by: Family member   Pertinent History TBI, chronic dystonia.   Patient Stated Goals Mother would like to be able to feed pt, wash pt, and shaving/brushing teeth.   Currently in Pain? No/denies        TREATMENT: Manual therapy: Performed STM along left paraspinal, upper traps, cervical extensors, levator scap, and periscapular muscular with the patient in sitting. Performed prolonged stretching to decrease tone of cervical rotation to the right and cervical lateral flexion to the right. Performed tactile cueing to patient's head to direct patient action. Patient held his head straight with minA for 20sec x 3 and constant cueing on maintaining erect head posture   Patient able to  achieve neutral head positioning after cueing and manual stretching and soft tissue       PT Education - 06/06/16 1041    Education provided Yes   Education Details form/technique with exercise   Person(s) Educated Patient   Methods Explanation;Demonstration   Comprehension Verbalized understanding;Returned demonstration             PT Long Term Goals - 03/14/16 0655      PT LONG TERM GOAL #1   Title Pt will demonstrate improved cervical posture as seen by decr. in line of redness in cervical region from 8" to less than 4" to decr. risk for skin breakdown   Time 12   Period Weeks   Status New     PT LONG TERM GOAL #2   Title Pt will be able to achieve 10 deg. cervical lateral flexion to R indicating improvement in motion to decr. strain on caregivers with ADLs   Baseline able to achieve -5 deg. to neutral   Time 12   Period Weeks   Status New               Plan - 06/06/16 1048    Clinical Impression Statement Focused on improving cervical muscular strength on the R side and improving stretching onto the L upper trap/levator scap secondary to increased muscle spasms and trigger points. Patient demonstrates improved cervical motion compared to previous visits and patient will benefit  from further skilled therapy to return to prior level of function.    Rehab Potential Fair   Clinical Impairments Affecting Rehab Potential TBI, family support   PT Frequency 1x / week   PT Duration 12 weeks   PT Treatment/Interventions Manual techniques;Wheelchair mobility training;Therapeutic activities;ADLs/Self Care Home Management;Functional mobility training;Therapeutic exercise   Consulted and Agree with Plan of Care Patient;Family member/caregiver      Patient will benefit from skilled therapeutic intervention in order to improve the following deficits and impairments:  Decreased range of motion, Impaired perceived functional ability, Impaired UE functional use, Improper body  mechanics  Visit Diagnosis: Stiffness of cervical spine  Muscle weakness (generalized)     Problem List Patient Active Problem List   Diagnosis Date Noted  . Sepsis (HCC) 08/19/2015    Myrene Galas, PT DPT 06/06/2016, 10:53 AM  [place] [facility] MAIN [facility] SERVICES [address] [place], [place], [address] Phone: [phone]   Fax:  [phone]  Name: Jacob Green MRN: [id number] Date of Birth: 07-Jun-1965

## 2016-06-19 ENCOUNTER — Ambulatory Visit: Payer: Medicare Other

## 2016-06-19 DIAGNOSIS — M6281 Muscle weakness (generalized): Secondary | ICD-10-CM

## 2016-06-19 DIAGNOSIS — M436 Torticollis: Secondary | ICD-10-CM | POA: Diagnosis not present

## 2016-06-19 NOTE — Therapy (Signed)
[place] [facility] MAIN [facility] SERVICES [address] [place], [place], [address] Phone: [phone]   Fax:  [phone]  Physical Therapy Treatment  Patient Details  Name: Jacob Green MRN: [id number] Date of Birth: 26-Nov-1965 Referring Provider: Malvin Johns  Encounter Date: 06/19/2016      PT End of Session - 06/19/16 1644    Visit Number 7   Number of Visits 17   Date for PT Re-Evaluation 07/04/16   Authorization Type g code   Authorization Time Period 7   PT Start Time 1545   PT Stop Time 1430   PT Time Calculation (min) 1365 min   Activity Tolerance Patient tolerated treatment well   Behavior During Therapy [facility] for tasks assessed/performed      Past Medical History:  Diagnosis Date  . Closed TBI (traumatic brain injury) (HCC) approximately 40 yrs ago  . Glaucoma   . Seizures (HCC)   . Urinary tract infection     History reviewed. No pertinent surgical history.  There were no vitals filed for this visit.      Subjective Assessment - 06/19/16 1642    Subjective Pateient's mother reports they have received a new wheelchair which is more narrow to allow for improved mobility. Patient's mother states patient's WC has a heel support insert to allow for proper head alignment.   Patient is accompained by: Family member   Pertinent History TBI, chronic dystonia.   Patient Stated Goals Mother would like to be able to feed pt, wash pt, and shaving/brushing teeth.   Currently in Pain? No/denies       TREATMENT: Manual therapy: Performed STM along left paraspinal, upper traps, cervical extensors, levator scap, and periscapular muscular with the patient in sitting. Performed prolonged stretching to decrease tone of cervical rotation to the right and cervical lateral flexion to the right as well as to decrease muscle spasms and pain. Performed tactile cueing to patient's head and right shouler to direct patient action when holding up head.  Patient  held his head straight with minA for 20sec x 3 and constant cueing on maintaining erect head posture  Patient able to achieve R side bending of 5 deg head positioning after cueing and manual stretching and soft tissue. Patient able to achieve position with maxA from PT       PT Education - 06/19/16 1643    Education provided Yes   Education Details Proper neck alignment in sitting   Person(s) Educated Patient   Methods Explanation;Demonstration   Comprehension Verbalized understanding;Returned demonstration             PT Long Term Goals - 03/14/16 7564      PT LONG TERM GOAL #1   Title Pt will demonstrate improved cervical posture as seen by decr. in line of redness in cervical region from 8" to less than 4" to decr. risk for skin breakdown   Time 12   Period Weeks   Status New     PT LONG TERM GOAL #2   Title Pt will be able to achieve 10 deg. cervical lateral flexion to R indicating improvement in motion to decr. strain on caregivers with ADLs   Baseline able to achieve -5 deg. to neutral   Time 12   Period Weeks   Status New               Plan - 06/19/16 1644    Clinical Impression Statement Patient demonstrates improved cervical alignment in sitting with  ability to hold head in greater R side bending compared to previous visits indicating funcitonal carryover. Patient demonstrates increased muscular speasms along upper trap with fascial limitations noted throughout and patient will benefit from further skilled therapy to return to prior level of function.    Rehab Potential Fair   Clinical Impairments Affecting Rehab Potential TBI, family support   PT Frequency 1x / week   PT Duration 12 weeks   PT Treatment/Interventions Manual techniques;Wheelchair mobility training;Therapeutic activities;ADLs/Self Care Home Management;Functional mobility training;Therapeutic exercise   Consulted and Agree with Plan of Care Patient;Family member/caregiver      Patient  will benefit from skilled therapeutic intervention in order to improve the following deficits and impairments:  Decreased range of motion, Impaired perceived functional ability, Impaired UE functional use, Improper body mechanics  Visit Diagnosis: Stiffness of cervical spine  Muscle weakness (generalized)     Problem List Patient Active Problem List   Diagnosis Date Noted  . Sepsis (HCC) 08/19/2015    Myrene Galas, PT DPT 06/19/2016, 4:48 PM  [place] [facility] MAIN [facility] SERVICES [address] [place], [place], [address] Phone: [phone]   Fax:  [phone]  Name: Jacob Green MRN: [id number] Date of Birth: [birth year]

## 2016-07-04 ENCOUNTER — Ambulatory Visit: Payer: Medicare Other | Attending: Neurology

## 2016-07-04 DIAGNOSIS — M436 Torticollis: Secondary | ICD-10-CM | POA: Diagnosis not present

## 2016-07-04 DIAGNOSIS — M6281 Muscle weakness (generalized): Secondary | ICD-10-CM | POA: Diagnosis present

## 2016-07-04 NOTE — Therapy (Signed)
[place] [facility] MAIN [facility] SERVICES [address] [place], [place], [address] Phone: [phone]   Fax:  [phone]  Physical Therapy Treatment  Patient Details  Name: Jacob Green MRN: [id number] Date of Birth: 1965-05-10 Referring Provider: Malvin Johns  Encounter Date: 07/04/2016      PT End of Session - 07/04/16 1002    Visit Number 8   Number of Visits 29   Date for PT Re-Evaluation 09/26/16   Authorization Type g code   Authorization Time Period 8   PT Start Time 0945   PT Stop Time 1025   PT Time Calculation (min) 40 min   Activity Tolerance Patient tolerated treatment well   Behavior During Therapy [facility] for tasks assessed/performed      Past Medical History:  Diagnosis Date  . Closed TBI (traumatic brain injury) (HCC) approximately 40 yrs ago  . Glaucoma   . Seizures (HCC)   . Urinary tract infection     History reviewed. No pertinent surgical history.  There were no vitals filed for this visit.      Subjective Assessment - 07/04/16 0940    Subjective Patient's mother reports the she has been working with patient on keeping his head up straight and rotating to the right side. She positions him at night time to encourage R lateral neck flexion. Patient is able to confirm that his is having some neck pain today but unable to rate on NPRS. No grimacing, groaning, or obvious distress noted upon arrival.    Patient is accompained by: Family member   Pertinent History TBI, chronic dystonia.   Patient Stated Goals Mother would like to be able to feed pt, wash pt, and shaving/brushing teeth.   Currently in Pain? No/denies            [facility] PT Assessment - 07/04/16 1315      ROM / Strength   AROM / PROM / Strength PROM     PROM   PROM Assessment Site Cervical   Cervical - Right Side Bend 2            TREATMENT:  Manual therapy Performed STM along left paraspinal, upper traps, cervical extensors, levator scap, SCM on L  side, and periscapular muscular with the patient in sitting. Performed prolonged low load long duration stretching to decrease tone of cervical rotation to the right cervical lateral flexion to the left as well as to decrease muscle spasms and pain. Performed tactile cueing to patient's head and right shouler to direct patient action when holding up head.  Patient held his head straight with minA/CGA for 20sec x 3 and constant cueing on maintaining erect head posture  Cervical PROM measurement obtained for R lateral flexion: 2 degrees; Discussed goals with mother; Education regarding positioning for sleep and stretches at home;                       PT Education - 07/04/16 0940    Education provided Yes   Education Details Positioning at night reinforced   Person(s) Educated Patient   Methods Explanation   Comprehension Verbalized understanding             PT Long Term Goals - 07/04/16 0941      PT LONG TERM GOAL #1   Title Pt will demonstrate improved cervical posture as seen by decr. in line of redness in cervical region from 8" to less than 4" to decr. risk for skin breakdown  Baseline 07/04/16: 5"   Time 12   Period Weeks   Status On-going     PT LONG TERM GOAL #2   Title Pt will be able to achieve 10 deg. cervical lateral flexion to R indicating improvement in motion to decr. strain on caregivers with ADLs   Baseline able to achieve -5 deg. to neutral; 07/04/16: 2 degrees past neutral   Time 12   Period Weeks   Status On-going               Plan - 07/04/16 0941    Clinical Impression Statement Patient continues to demonstrate improved cervical alignment in sitting. He is able to initiate R lateral flexion and hold his neck in upright positions for longer periods of time. His R lateral neck flexion is now 2 degrees past neutral which is a significant improvement from -5 degrees at his initial evaluation in January. Mother reports that this has  significantly improved patients ability to eat and has improved the skin breakdown on his L lateral neck. It has also made it easier for them to shave his face. Pt will continue to benefit from skilled PT services to address deficits in his cervical motion in order to decrease pain and improve his ability to eat and caregivers to perform ADLs.    Rehab Potential Fair   Clinical Impairments Affecting Rehab Potential TBI, family support   PT Frequency 1x / week   PT Duration 12 weeks   PT Treatment/Interventions Manual techniques;Wheelchair mobility training;Therapeutic activities;ADLs/Self Care Home Management;Functional mobility training;Therapeutic exercise   PT Next Visit Plan Continue with cervical STM, stretching, and active cervical motion   PT Home Exercise Plan neck stretching   Consulted and Agree with Plan of Care Patient;Family member/caregiver      Patient will benefit from skilled therapeutic intervention in order to improve the following deficits and impairments:  Decreased range of motion, Impaired perceived functional ability, Impaired UE functional use, Improper body mechanics  Visit Diagnosis: Stiffness of cervical spine - Plan: PT plan of care cert/re-cert  Muscle weakness (generalized) - Plan: PT plan of care cert/re-cert     Problem List Patient Active Problem List   Diagnosis Date Noted  . Sepsis (HCC) 08/19/2015   Lynnea Maizes PT, DPT   Prayan Ulin 07/04/2016, 1:19 PM  [place] [facility] MAIN [facility] SERVICES [address] [place], [place], [address] Phone: [phone]   Fax:  [phone]  Name: Jacob Green MRN: [id number] Date of Birth: 1965/08/16

## 2016-07-18 ENCOUNTER — Ambulatory Visit: Payer: Medicare Other

## 2016-07-18 DIAGNOSIS — M436 Torticollis: Secondary | ICD-10-CM

## 2016-07-18 DIAGNOSIS — M6281 Muscle weakness (generalized): Secondary | ICD-10-CM

## 2016-07-18 NOTE — Therapy (Signed)
[place] [facility] PHYSICAL AND SPORTS MEDICINE [address] [place], [place], [address] Phone: [phone]   Fax:  [phone]  Physical Therapy Treatment  Patient Details  Name: Jacob Green MRN: [id number] Date of Birth: 05/24/1965 Referring Provider: Malvin JohnsPotter  Encounter Date: 07/18/2016      PT End of Session - 07/18/16 1044    Visit Number 9   Number of Visits 29   Date for PT Re-Evaluation 09/26/16   Authorization Type g code   Authorization Time Period 9   PT Start Time 0927   PT Stop Time 1005   PT Time Calculation (min) 38 min   Activity Tolerance Patient tolerated treatment well   Behavior During Therapy [facility] for tasks assessed/performed      Past Medical History:  Diagnosis Date  . Closed TBI (traumatic brain injury) (HCC) approximately 40 yrs ago  . Glaucoma   . Seizures (HCC)   . Urinary tract infection     History reviewed. No pertinent surgical history.  There were no vitals filed for this visit.      Subjective Assessment - 07/18/16 1034    Subjective Patient's mother reports she been working on positioning when watching the TV to encourage improvement in R sided cervical lateral flexion. Patient's mother states its easier for the patient to move his head laterally.    Patient is accompained by: Family member   Pertinent History TBI, chronic dystonia.   Patient Stated Goals Mother would like to be able to feed pt, wash pt, and shaving/brushing teeth.   Currently in Pain? No/denies      TREATMENT: Manual therapy: Performed STM along left paraspinal, upper traps, cervical extensors, levator scap, and periscapular muscular with the patient in sitting.  Performed prolonged stretching to decrease tone by performing cervical rotation to the right and cervical lateral flexion to the right -- stretching held 3 x 2 min .   Patient held his head straight with minA for 20sec x 5 and constant cueing on maintaining erect head  posture.Performed tactile cueing to patient's lateral aspect of the right side of his head, neck and right shouler to direct patient action when holding up head.  Patient able to achieve R side bending of 5 deg head positioning after cueing and manual stretching and soft tissue. Patient able to achieve position with maxA from PT; Improved cervical rotation to the R today.        PT Education - 07/18/16 1040    Education provided Yes   Education Details Educated on improved TV positioning to improve cervical resting position   Person(s) Educated Patient   Methods Explanation;Demonstration   Comprehension Verbalized understanding;Returned demonstration             PT Long Term Goals - 07/04/16 0941      PT LONG TERM GOAL #1   Title Pt will demonstrate improved cervical posture as seen by decr. in line of redness in cervical region from 8" to less than 4" to decr. risk for skin breakdown   Baseline 07/04/16: 5"   Time 12   Period Weeks   Status On-going     PT LONG TERM GOAL #2   Title Pt will be able to achieve 10 deg. cervical lateral flexion to R indicating improvement in motion to decr. strain on caregivers with ADLs   Baseline able to achieve -5 deg. to neutral; 07/04/16: 2 degrees past neutral   Time 12   Period Weeks  Status On-going               Plan - 07/18/16 1139    Clinical Impression Statement Patient continues to improve in cervical lateral flexion/rotation (pt able to achieve neutral head alignment with OP) thus improving ability to eat and decrease risk of skin breakdown. Patient demonstrates greater volunary rotation of his head/neck and responds well to treatment. Patient continues to demonstrate increased muscular spasms along his UTs and periscapular musculature and patient will benefit from further skilled therapy focused on improving cervical function to improve eating ability   Rehab Potential Fair   Clinical Impairments Affecting Rehab Potential  TBI, family support   PT Frequency 1x / week   PT Duration 12 weeks   PT Treatment/Interventions Manual techniques;Wheelchair mobility training;Therapeutic activities;ADLs/Self Care Home Management;Functional mobility training;Therapeutic exercise   PT Next Visit Plan Continue with cervical STM, stretching, and active cervical motion   PT Home Exercise Plan neck stretching   Consulted and Agree with Plan of Care Patient;Family member/caregiver      Patient will benefit from skilled therapeutic intervention in order to improve the following deficits and impairments:  Decreased range of motion, Impaired perceived functional ability, Impaired UE functional use, Improper body mechanics  Visit Diagnosis: Stiffness of cervical spine  Muscle weakness (generalized)     Problem List Patient Active Problem List   Diagnosis Date Noted  . Sepsis (HCC) 08/19/2015    Myrene Galas, PT DPT 07/18/2016, 12:42 PM  [place] [facility] PHYSICAL AND SPORTS MEDICINE [address], [place], [address] Phone: [phone]   Fax:  [phone]  Name: Jacob Green MRN: [id number] Date of Birth: 1965/11/01

## 2016-08-01 ENCOUNTER — Ambulatory Visit: Payer: Medicare Other

## 2016-08-01 DIAGNOSIS — M436 Torticollis: Secondary | ICD-10-CM | POA: Diagnosis not present

## 2016-08-01 DIAGNOSIS — M6281 Muscle weakness (generalized): Secondary | ICD-10-CM

## 2016-08-01 NOTE — Therapy (Signed)
[facility] [address] [place], [address] Phone: [phone]   Fax:  [phone]  Physical Therapy Treatment  Patient Details  Name: Jacob Green MRN: [id number] Date of Birth: [birth year] Referring Provider: Malvin Johns  Encounter Date: 08/01/2016      PT End of Session - 08/01/16 1056    Visit Number 10   Number of Visits 29   Date for PT Re-Evaluation 09/26/16   Authorization Type g code   Authorization Time Period 10   PT Start Time 0925   PT Stop Time 1005   PT Time Calculation (min) 40 min   Activity Tolerance Patient tolerated treatment well   Behavior During Therapy [facility] for tasks assessed/performed      Past Medical History:  Diagnosis Date  . Closed TBI (traumatic brain injury) (HCC) approximately 40 yrs ago  . Glaucoma   . Seizures (HCC)   . Urinary tract infection     History reviewed. No pertinent surgical history.  There were no vitals filed for this visit.      Subjective Assessment - 08/01/16 1046    Subjective Patient's mother reports she has been working on [place] with keep his head erect for longer periods of time. Patinet's mother reports she believe the stretching and performing STM along the SCM will help improve lateral flexion to the R.    Patient is accompained by: Family member   Pertinent History TBI, chronic dystonia.   Patient Stated Goals Mother would like to be able to feed pt, wash pt, and shaving/brushing teeth.        TREATMENT: G codes Manual therapy: Performed STM along left paraspinal, upper traps, cervical extensors, levator scap, and periscapular muscular with the patient in sitting.  Performed prolonged stretching to decrease tone by performing cervical rotation to the right and cervical lateral flexion to the right -- stretching held 3 x 5 min .    Patient held his head straight with minA for 30sec x 3 and constant cueing on maintaining erect head  posture.Performed tactile cueing to patient's lateral aspect of the right side of his head, neck and right shouler to direct patient action when holding up head.   Patient able to achieve R side bending of 3 deg head positioning after cueing and manual stretching and soft tissue. Patient able to achieve position with maxA from PT; Improved cervical rotation to the R today.          PT Education - 08/01/16 1053    Education provided Yes   Education Details Educated on muscle guarding and tone limiting cervical ROM   Person(s) Educated Patient   Methods Explanation;Demonstration   Comprehension Verbalized understanding;Returned demonstration             PT Long Term Goals - 08/01/16 1101      PT LONG TERM GOAL #1   Title Pt will demonstrate improved cervical posture as seen by decr. in line of redness in cervical region from 8" to less than 4" to decr. risk for skin breakdown   Baseline 07/04/16: 5" 08/01/16: 5"   Time 12   Period Weeks   Status On-going     PT LONG TERM GOAL #2   Title Pt will be able to achieve 10 deg. cervical lateral flexion to R indicating improvement in motion to decr. strain on caregivers with ADLs   Baseline able to achieve -5 deg. to neutral; 07/04/16: 2 degrees past neutral; 08/01/16:  3 degrees past neutral   Time 12   Period Weeks   Status On-going               Plan - 08/01/16 1058    Clinical Impression Statement Patient making progress towards long term goals with ability to achieve 3 degrees of lateral cervical flexion with OP from PT. Patinet demonstrates similar amount of redness compared to previous visit with span aroun 5in in length. Patient demonstrates improved ability to maintain head in neutral position. Patient able to hold head erect 3 x 30sec which is much improved compared to initial visits. Patient continues to demonstrate decreased R lateral flexion AROM and will benefit from further skilled therapy to improve upon this  limitation.    Rehab Potential Fair   Clinical Impairments Affecting Rehab Potential TBI, family support   PT Frequency 1x / week   PT Duration 12 weeks   PT Treatment/Interventions Manual techniques;Wheelchair mobility training;Therapeutic activities;ADLs/Self Care Home Management;Functional mobility training;Therapeutic exercise   PT Next Visit Plan Continue with cervical STM, stretching, and active cervical motion   PT Home Exercise Plan neck stretching   Consulted and Agree with Plan of Care Patient;Family member/caregiver      Patient will benefit from skilled therapeutic intervention in order to improve the following deficits and impairments:  Decreased range of motion, Impaired perceived functional ability, Impaired UE functional use, Improper body mechanics  Visit Diagnosis: Muscle weakness (generalized)  Stiffness of cervical spine  Muscle weakness       G-Codes - 08/01/16 1225    Functional Assessment Tool Used (Outpatient Only) Posture, ADL participation (feeding)   Functional Limitation Changing and maintaining body position   Changing and Maintaining Body Position Current Status (E4540(G8981) At least 1 percent but less than 20 percent impaired, limited or restricted   Changing and Maintaining Body Position Goal Status (J8119(G8982) At least 1 percent but less than 20 percent impaired, limited or restricted      Problem List Patient Active Problem List   Diagnosis Date Noted  . Sepsis (HCC) 08/19/2015    Myrene GalasWesley Adnan Vanvoorhis, PT DPT 08/01/2016, 12:26 PM  [place] [facility] [address] [place], [place], [address] Phone: [phone]   Fax:  [phone]  Name: Jacob Green MRN: [id number] Date of Birth: 06/23/1965

## 2016-08-15 ENCOUNTER — Ambulatory Visit: Payer: Medicare Other | Attending: Neurology

## 2016-08-15 DIAGNOSIS — M6281 Muscle weakness (generalized): Secondary | ICD-10-CM | POA: Diagnosis not present

## 2016-08-15 DIAGNOSIS — M436 Torticollis: Secondary | ICD-10-CM | POA: Insufficient documentation

## 2016-08-15 NOTE — Therapy (Signed)
[facility] [address] [place], [address] Phone: [phone]   Fax:  [phone]  Physical Therapy Treatment  Patient Details  Name: Jacob Green MRN: [id number] Date of Birth: [birth year] Referring Provider: Malvin Johns  Encounter Date: 08/15/2016      PT End of Session - 08/15/16 1244    Visit Number 11   Number of Visits 29   Date for PT Re-Evaluation 09/26/16   Authorization Type g code   Authorization Time Period 1   PT Start Time 0923   PT Stop Time 0957   PT Time Calculation (min) 34 min   Activity Tolerance Patient tolerated treatment well   Behavior During Therapy [facility] for tasks assessed/performed      Past Medical History:  Diagnosis Date  . Closed TBI (traumatic brain injury) (HCC) approximately 40 yrs ago  . Glaucoma   . Seizures (HCC)   . Urinary tract infection     History reviewed. No pertinent surgical history.  There were no vitals filed for this visit.      Subjective Assessment - 08/15/16 1241    Subjective Patient's mother reports she has difficulty with stretching the patients neck recently and would like to make sure he is not regressing. Patient's mother states the patient's skin marking has not been as irratated after adding neosporin to the area.   Patient is accompained by: Family member   Pertinent History TBI, chronic dystonia.   Patient Stated Goals Mother would like to be able to feed pt, wash pt, and shaving/brushing teeth.   Currently in Pain? No/denies         TREATMENT: Manual therapy: Performed STM along left paraspinal, upper traps, cervical extensors, levator scap, and periscapular muscular with the patient in sitting.  Performed prolonged stretching to decrease tone by performing cervical rotation to the right and cervical lateral flexion to the right -- stretching held 2 x 10 min .   Patient held his head straight with minA for 30sec x 3 and constant  cueing on maintaining erect head posture.Performed tactile cueing to patient's lateral aspect of the right side of his head, neck and right shouler to direct patient action when holding up head.  Patient able to achieve R side bending of 5 deg head positioning after cueing and manual stretching and soft tissue. Patient able to achieve position with maxA from PT;       PT Education - 08/15/16 1243    Education provided Yes   Education Details Educated on proper stretching technique   Person(s) Educated Patient   Methods Demonstration;Explanation   Comprehension Verbalized understanding;Returned demonstration             PT Long Term Goals - 08/01/16 1101      PT LONG TERM GOAL #1   Title Pt will demonstrate improved cervical posture as seen by decr. in line of redness in cervical region from 8" to less than 4" to decr. risk for skin breakdown   Baseline 07/04/16: 5" 08/01/16: 5"   Time 12   Period Weeks   Status On-going     PT LONG TERM GOAL #2   Title Pt will be able to achieve 10 deg. cervical lateral flexion to R indicating improvement in motion to decr. strain on caregivers with ADLs   Baseline able to achieve -5 deg. to neutral; 07/04/16: 2 degrees past neutral; 08/01/16: 3 degrees past neutral   Time 12   Period Weeks  Status On-going               Plan - 08/15/16 1244    Clinical Impression Statement Patient demonstrates improvement with holding cervical spine in erect posture but conitnues to demonstrate increased cervical flexion with cervical lateral flexion with marked difficulty with separating the movements. Patient will benefit from further skilled therapy to return to prior level of function.    Rehab Potential Fair   Clinical Impairments Affecting Rehab Potential TBI, family support   PT Frequency 1x / week   PT Duration 12 weeks   PT Treatment/Interventions Manual techniques;Wheelchair mobility training;Therapeutic activities;ADLs/Self Care Home  Management;Functional mobility training;Therapeutic exercise   PT Next Visit Plan Continue with cervical STM, stretching, and active cervical motion   PT Home Exercise Plan neck stretching   Consulted and Agree with Plan of Care Patient;Family member/caregiver      Patient will benefit from skilled therapeutic intervention in order to improve the following deficits and impairments:  Decreased range of motion, Impaired perceived functional ability, Impaired UE functional use, Improper body mechanics  Visit Diagnosis: Muscle weakness (generalized)  Stiffness of cervical spine  Muscle weakness     Problem List Patient Active Problem List   Diagnosis Date Noted  . Sepsis (HCC) 08/19/2015    Myrene GalasWesley Shalissa Easterwood, PT DPT 08/15/2016, 12:48 PM  [place] [facility] [address] [place], [place], [address] Phone: [phone]   Fax:  [phone]  Name: Lavina HammanSteven G Goodhart MRN: [id number] Date of Birth: 02/16/1966

## 2016-08-29 ENCOUNTER — Ambulatory Visit: Payer: Medicare Other

## 2016-08-29 DIAGNOSIS — M6281 Muscle weakness (generalized): Secondary | ICD-10-CM | POA: Diagnosis not present

## 2016-08-29 DIAGNOSIS — M436 Torticollis: Secondary | ICD-10-CM

## 2016-08-29 NOTE — Therapy (Signed)
[place] [facility] PH[facility]ORTS MEDICINE [address] [place], [address] Phone: [phone]   Fax:  [phone]  Physical Therapy Treatment  Patient Details  Name: DAILAN PFALZGRAF MRN: [id number] Date of Birth: 02/15/1966 Referring Provider: Malvin Johns  Encounter Date: 08/29/2016      PT End of Session - 08/29/16 1241    Visit Number 12   Number of Visits 29   Date for PT Re-Evaluation 09/26/16   Authorization Type g code   Authorization Time Period 2   PT Start Time 0928   PT Stop Time 1013   PT Time Calculation (min) 45 min   Activity Tolerance Patient tolerated treatment well   Behavior During Therapy [facility] for tasks assessed/performed      Past Medical History:  Diagnosis Date  . Closed TBI (traumatic brain injury) (HCC) approximately 40 yrs ago  . Glaucoma   . Seizures (HCC)   . Urinary tract infection     No past surgical history on file.  There were no vitals filed for this visit.      Subjective Assessment - 08/29/16 1232    Subjective Patient mother reports she's been working on improving his cervical muscle length. Patient's mother reports he's been able to bend his neck. while lying down.    Patient is accompained by: Family member   Pertinent History TBI, chronic dystonia.   Patient Stated Goals Mother would like to be able to feed pt, wash pt, and shaving/brushing teeth.   Currently in Pain? No/denies      TREATMENT: Manual therapy: Performed STM along left paraspinal, upper traps, SCM, scalenes cervical extensors, levator scap, and periscapular muscular with the patient in sitting.  Performed prolonged stretching to decrease tone by performing cervical rotation to the right and cervical lateral flexion to the right -- stretching held 4 x 8 min .    Patient held his head straight with minA for 30sec x 3 and constant cueing on maintaining erect head posture.Performed tactile cueing to patient's lateral aspect of  the right side of his head, neck and right shoulder to direct patient action when holding up head.   Patient able to achieve (with PT assist) R side bending of 5 deg head positioning after cueing and manual stretching and soft tissue. Patient able to achieve position with maxA from PT; Patient demonstrates improvement with cervical motion after performing STM; Patient demonstrates improvement in redness along L skin folds along lateral neck       PT Education - 08/29/16 1240    Education provided Yes   Education Details Educated on proper stretching techinque   Person(s) Educated Patient   Methods Explanation;Demonstration   Comprehension Verbalized understanding;Returned demonstration             PT Long Term Goals - 08/01/16 1101      PT LONG TERM GOAL #1   Title Pt will demonstrate improved cervical posture as seen by decr. in line of redness in cervical region from 8" to less than 4" to decr. risk for skin breakdown   Baseline 07/04/16: 5" 08/01/16: 5"   Time 12   Period Weeks   Status On-going     PT LONG TERM GOAL #2   Title Pt will be able to achieve 10 deg. cervical lateral flexion to R indicating improvement in motion to decr. strain on caregivers with ADLs   Baseline able to achieve -5 deg. to neutral; 07/04/16: 2 degrees past neutral; 08/01/16: 3  degrees past neutral   Time 12   Period Weeks   Status On-going               Plan - 08/29/16 1242    Clinical Impression Statement Patient demonstrates improvement with maintaining cervical lateral flexion with exercise. Patient demonstrates decreased redness along skin folds on the L side. Patient will benefit from further skilled therapy focused on improving cervical motion. Patient will benefit from further skilled therapy to return to prior level of function.    Rehab Potential Fair   Clinical Impairments Affecting Rehab Potential TBI, family support   PT Frequency 1x / week   PT Duration 12 weeks   PT  Treatment/Interventions Manual techniques;Wheelchair mobility training;Therapeutic activities;ADLs/Self Care Home Management;Functional mobility training;Therapeutic exercise   PT Next Visit Plan Continue with cervical STM, stretching, and active cervical motion   PT Home Exercise Plan neck stretching   Consulted and Agree with Plan of Care Patient;Family member/caregiver      Patient will benefit from skilled therapeutic intervention in order to improve the following deficits and impairments:  Decreased range of motion, Impaired perceived functional ability, Impaired UE functional use, Improper body mechanics  Visit Diagnosis: Muscle weakness (generalized)  Stiffness of cervical spine  Muscle weakness     Problem List Patient Active Problem List   Diagnosis Date Noted  . Sepsis (HCC) 08/19/2015    Myrene GalasWesley Britainy Kozub, PT DPT 08/29/2016, 12:46 PM  [facility] [address] [place], [place], [id number] Phone: [phone]   Fax:  [phone]  Name: Lavina HammanSteven G Rieger MRN: [id number] Date of Birth: 08/19/1965

## 2016-09-12 ENCOUNTER — Ambulatory Visit: Payer: Medicare Other

## 2016-09-26 ENCOUNTER — Ambulatory Visit: Payer: Medicare Other | Attending: Neurology

## 2016-09-26 DIAGNOSIS — M436 Torticollis: Secondary | ICD-10-CM

## 2016-09-26 DIAGNOSIS — M6281 Muscle weakness (generalized): Secondary | ICD-10-CM | POA: Diagnosis not present

## 2016-09-26 NOTE — Therapy (Signed)
[facility] [address] [place], [place], [address] Phone: [phone]   Fax:  [phone]  Physical Therapy Treatment  Patient Details  Name: Jacob Green MRN: [id number] Date of Birth: 05/28/1965 Referring Provider: Malvin JohnsPotter  Encounter Date: 09/26/2016      PT End of Session - 09/26/16 1003    Visit Number 13   Number of Visits 29   Date for PT Re-Evaluation 12/19/16   Authorization Type g code   Authorization Time Period 3   PT Start Time 0905   PT Stop Time 0950   PT Time Calculation (min) 45 min   Activity Tolerance Patient tolerated treatment well   Behavior [facility] for tasks assessed/performed      Past Medical History:  Diagnosis Date  . Closed TBI (traumatic brain injury) (HCC) approximately 40 yrs ago  . Glaucoma   . Seizures (HCC)   . Urinary tract infection     History reviewed. No pertinent surgical history.  There were no vitals filed for this visit.      Subjective Assessment - 09/26/16 0956    Subjective Patient's mother reports [name] has been sick a couple weeks ago. and had to miss his appointment. Patient's mother states he has not been able to bend his neck as much because of sickness and reports his neck has been tighter since missing her appointment next week.    Patient is accompained by: Family member   Pertinent History TBI, chronic dystonia.   Patient Stated Goals Mother would like to be able to feed pt, wash pt, and shaving/brushing teeth.   Currently in Pain? No/denies       TREATMENT: Manual therapy: Performed STM along left paraspinal, upper traps, SCM, scalenes cervical extensors/rotators, levator scap, and periscapular muscular with the patient in sitting.  Performed prolonged stretching to decrease tone by performing cervical rotation to the right and cervical lateral flexion to the right -- stretching held 3 x 5 min.    Patient held his head straight  with minA for 20se, 6sec and 10sec and constant tactile and verbal cueing required to maintain erect head posture. Performed tactile cueing to patient's lateral aspect of the right side of his head, neck and right shoulder to direct patient action when holding up erect.   Patient able to achieve (with PT assist) R side bending of -10 deg head positioning after cueing and manual stretching and soft tissue. Patient able to achieve position with maxA from PT; Patient demonstrates improvement with cervical motion after performing STM; Patient demonstrates increased redness along L skin folds along lateral neck today. Observation: Redness along L lateral cervical spine: (2) 5" red lines  Cervical R Lateral flexion: -5degs       PT Education - 09/26/16 1002    Education provided Yes   Education Details form/technique with performance   Person(s) Educated Patient   Methods Explanation;Demonstration   Comprehension Verbalized understanding;Returned demonstration             PT Long Term Goals - 09/26/16 1123      PT LONG TERM GOAL #1   Title Pt will demonstrate improved cervical posture as seen by decr. in line of redness in cervical region from 8" to less than 4" to decr. risk for skin breakdown   Baseline 07/04/16: 5" 08/01/16: 5"; 09/26/16: 5"   Time 12   Period Weeks   Status On-going     PT LONG TERM GOAL #  2   Title Pt will be able to achieve 10 deg. cervical lateral flexion to R indicating improvement in motion to decr. strain on caregivers with ADLs   Baseline able to achieve -5 deg. to neutral; 07/04/16: 2 degrees past neutral; 08/01/16: 3 degrees past neutral 09/26/2016: 5 deg from neutral   Time 12   Period Weeks   Status On-going               Plan - 09/26/16 1118    Clinical Impression Statement Patient demonstrates decreased endurance with holding head erect today, most likely from missing appointment on his last visit and being sick 2 weeks prior. Patient's mother  states his neck has been more stiff which correlates with decreased cervical motion with exercise performance today. Patient continues to demonstrate increased tone along his L lateral cervical musculature which is improved after performing STM. Patient redness along his lateral neck is worse today than previous visit most likely secondary to prolonged lateral flexed posture. Patient will benefit from further skilled therapy focused on improved head posture to return to prior level of function.    Rehab Potential Fair   Clinical Impairments Affecting Rehab Potential TBI, family support   PT Frequency 1x / week   PT Duration 12 weeks   PT Treatment/Interventions Manual techniques;Wheelchair mobility training;Therapeutic activities;ADLs/Self Care Home Management;Functional mobility training;Therapeutic exercise   PT Next Visit Plan Continue with cervical STM, stretching, and active cervical motion   PT Home Exercise Plan neck stretching   Consulted and Agree with Plan of Care Patient;Family member/caregiver      Patient will benefit from skilled therapeutic intervention in order to improve the following deficits and impairments:  Decreased range of motion, Impaired perceived functional ability, Impaired UE functional use, Improper body mechanics  Visit Diagnosis: Muscle weakness (generalized) - Plan: PT plan of care cert/re-cert  Stiffness of cervical spine - Plan: PT plan of care cert/re-cert  Muscle weakness - Plan: PT plan of care cert/re-cert     Problem List Patient Active Problem List   Diagnosis Date Noted  . Sepsis (HCC) 08/19/2015    Myrene GalasWesley Leanard Dimaio, PT DPT 09/26/2016, 11:27 AM  [place] [facility] [address] , [place], [address] Phone: [phone]   Fax:  [phone]  Name: Jacob Green MRN: [id number] Date of Birth: 12/16/1965

## 2016-10-10 ENCOUNTER — Ambulatory Visit: Payer: Medicare Other | Attending: Neurology

## 2016-10-10 DIAGNOSIS — M6281 Muscle weakness (generalized): Secondary | ICD-10-CM

## 2016-10-10 DIAGNOSIS — M436 Torticollis: Secondary | ICD-10-CM | POA: Diagnosis present

## 2016-10-10 NOTE — Therapy (Signed)
[place] [facility] [address] [place], [address] Phone: [phone]   Fax:  [phone]  Physical Therapy Treatment  Patient Details  Name: Jacob Green MRN: [id number] Date of Birth: December 20, 1965 Referring Provider: Malvin Johns  Encounter Date: 10/10/2016      PT End of Session - 10/10/16 1010    Visit Number 14   Number of Visits 29   Date for PT Re-Evaluation 12/19/16   Authorization Type g code   Authorization Time Period 4   PT Start Time 1005   PT Stop Time 1045   PT Time Calculation (min) 40 min   Activity Tolerance Patient tolerated treatment well   Behavior During Therapy [facility] for tasks assessed/performed      Past Medical History:  Diagnosis Date  . Closed TBI (traumatic brain injury) (HCC) approximately 40 yrs ago  . Glaucoma   . Seizures (HCC)   . Urinary tract infection     History reviewed. No pertinent surgical history.  There were no vitals filed for this visit.      Subjective Assessment - 10/10/16 1003    Subjective Patient's mother reports Chief has been holding his head into a greater amount of side bending recently. Patient mother states she has difficulty lifting the patient's head to help stretch out and achieve erect head posture.    Patient is accompained by: Family member   Pertinent History TBI, chronic dystonia.   Patient Stated Goals Mother would like to be able to feed pt, wash pt, and shaving/brushing teeth.   Currently in Pain? No/denies        TREATMENT:  Manual therapy: Performed STM along left paraspinal, upper traps, SCM, scalenes cervical extensors/rotators, levator scap, and periscapular muscular with the patient in sitting. Focused today on performing STM to patient's deep cervical extension as he demonstrates increased tone in the area Performed prolonged stretching to decrease tone by performing cervical rotation to the right and cervical lateral flexion to the  right -- stretching held 2 x 15 min.    Patient held his head straight with tactile cueing and minA for 20sec x 3 and constant tactile and verbal cueing required to maintain erect head posture. Performed tactile cueing to patient's lateral aspect of the right side of his head, neck and right shoulder to direct patient action when holding up erect.   Patient able to achieve (with PT assist) R side bending of 3-5 deg head positioning after cueing and manual stretching and soft tissue. Patient able to achieve position with maxA from PT; Patient demonstrates improvement with cervical motion after performing STM; Patient demonstrates increased redness along L skin folds along lateral neck today.  Patient demonstrates no increase in pain throughout totality of treatment session.        PT Education - 10/10/16 1008    Education provided Yes   Education Details form/technique with exercise performance   Person(s) Educated Patient   Methods Explanation;Demonstration   Comprehension Verbalized understanding;Returned demonstration             PT Long Term Goals - 09/26/16 1123      PT LONG TERM GOAL #1   Title Pt will demonstrate improved cervical posture as seen by decr. in line of redness in cervical region from 8" to less than 4" to decr. risk for skin breakdown   Baseline 07/04/16: 5" 08/01/16: 5"; 09/26/16: 5"   Time 12   Period Weeks   Status On-going  PT LONG TERM GOAL #2   Title Pt will be able to achieve 10 deg. cervical lateral flexion to R indicating improvement in motion to decr. strain on caregivers with ADLs   Baseline able to achieve -5 deg. to neutral; 07/04/16: 2 degrees past neutral; 08/01/16: 3 degrees past neutral 09/26/2016: 5 deg from neutral   Time 12   Period Weeks   Status On-going               Plan - 10/10/16 1035    Clinical Impression Statement Continued to focus on improving holding head in an erect posture throughtout treatment session. Patient  demonstrates improvement in cervical posture after performing STM to the cervical muscular most specifially to the L cervical extensors on the affected side. Patiient demonstrates decreased endurance with holding a cervical erect posture with inability to maintain cervical posture for greater than 20sec before fatigue. Patient will benefit from further skilled therapy to return to prior level of function.    Rehab Potential Fair   Clinical Impairments Affecting Rehab Potential TBI, family support   PT Frequency 1x / week   PT Duration 12 weeks   PT Treatment/Interventions Manual techniques;Wheelchair mobility training;Therapeutic activities;ADLs/Self Care Home Management;Functional mobility training;Therapeutic exercise   PT Next Visit Plan Continue with cervical STM, stretching, and active cervical motion   PT Home Exercise Plan neck stretching   Consulted and Agree with Plan of Care Patient;Family member/caregiver      Patient will benefit from skilled therapeutic intervention in order to improve the following deficits and impairments:  Decreased range of motion, Impaired perceived functional ability, Impaired UE functional use, Improper body mechanics  Visit Diagnosis: Muscle weakness (generalized)  Stiffness of cervical spine  Muscle weakness     Problem List Patient Active Problem List   Diagnosis Date Noted  . Sepsis (HCC) 08/19/2015    Myrene GalasWesley Mary [place], PT DPT 10/10/2016, 10:43 AM  [place] [facility] [address] [place], [place], [address] Phone: [phone]   Fax:  [phone]  Name: Jacob Green MRN: [id number] Date of Birth: 12/16/1965

## 2016-10-24 ENCOUNTER — Ambulatory Visit: Payer: Medicare Other

## 2016-10-24 DIAGNOSIS — M6281 Muscle weakness (generalized): Secondary | ICD-10-CM

## 2016-10-24 DIAGNOSIS — M436 Torticollis: Secondary | ICD-10-CM

## 2016-10-24 NOTE — Therapy (Signed)
[place] [facility] [address], [place], [address] Phone: [phone]   Fax:  [phone]  Physical Therapy Treatment  Patient Details  Name: Jacob Green MRN: [id number] Date of Birth: 1965/07/04 Referring Provider: Malvin Johns  Encounter Date: 10/24/2016      PT End of Session - 10/24/16 1056    Visit Number 15   Number of Visits 29   Date for PT Re-Evaluation 12/19/16   Authorization Type g code   Authorization Time Period 5   PT Start Time 0925   PT Stop Time 1003   PT Time Calculation (min) 38 min   Activity Tolerance Patient tolerated treatment well   Behavior During Therapy [facility] for tasks assessed/performed      Past Medical History:  Diagnosis Date  . Closed TBI (traumatic brain injury) (HCC) approximately 40 yrs ago  . Glaucoma   . Seizures (HCC)   . Urinary tract infection     History reviewed. No pertinent surgical history.  There were no vitals filed for this visit.      Subjective Assessment - 10/24/16 1054    Subjective Patient's mother reports she was able to sustain pt's posture for much longer and easier since the previous visit. Patient's mother states she would like to be instructed how to palpate the affected musculature to continue performing work at home.    Patient is accompained by: Family member   Pertinent History TBI, chronic dystonia.   Patient Stated Goals Mother would like to be able to feed pt, wash pt, and shaving/brushing teeth.   Currently in Pain? No/denies        TREATMENT:  Manual therapy: Performed STM along left paraspinal, upper traps, SCM, scalenes cervical extensors/rotators, levator scap, and periscapular muscular with the patient in sitting. Focused today on performing STM to patient's deep cervical/capital extensors as he demonstrates increased tone in the area  Performed prolonged stretching to decrease tone by performingcervical rotation to the right  and cervical lateral flexion to the right -- stretching held 3 x .   Patient held his head with ~ 20 deg from neutral with tactile cueing and minA for 20sec x 3and constant tactile and verbal cueing required to maintain erect head posture. Performed tactile cueing to patient's lateral aspect of the right side of his head, neckand right shoulder to direct patient action when holding up erect.  Patient able to achieve (with PT assist) R side bending of 3 deg head positioning after cueing and manual stretching and soft tissue. Patient able to achieve position with maxA from PT; Patient demonstrates improvement with cervical motion after performing STM; Patient demonstrates increased redness along L skin folds along lateral neck today.  Educated mother on palpation of the cervical/capital extensors with tone   Patient demonstrates no increase in pain throughout totality of treatment session.        PT Education - 10/24/16 1055    Education provided Yes   Education Details form/technique; educated mother on palpation technique   Person(s) Educated Patient;Parent(s)   Methods Explanation;Demonstration   Comprehension Returned demonstration;Verbalized understanding             PT Long Term Goals - 09/26/16 1123      PT LONG TERM GOAL #1   Title Pt will demonstrate improved cervical posture as seen by decr. in line of redness in cervical region from 8" to less than 4" to decr. risk for skin breakdown   Baseline 07/04/16:  5" 08/01/16: 5"; 09/26/16: 5"   Time 12   Period Weeks   Status On-going     PT LONG TERM GOAL #2   Title Pt will be able to achieve 10 deg. cervical lateral flexion to R indicating improvement in motion to decr. strain on caregivers with ADLs   Baseline able to achieve -5 deg. to neutral; 07/04/16: 2 degrees past neutral; 08/01/16: 3 degrees past neutral 09/26/2016: 5 deg from neutral   Time 12   Period Weeks   Status On-going               Plan -  10/24/16 1056    Clinical Impression Statement Patient demonstrates ability to independent sustain erect cervical posture throughout therapy today indicating functional carryover between visits. Patient demonstrates increased tone along cervical capital extensors which is improved with STM to the area. Patient demonstrates to hold head up with ~ -20deg from neutral (L cervical lateral flexion) for 3 x 20 seconds indicating decreased muscular endurance. Patient will benefit from further skilled therapy to return to prior level of function.    Rehab Potential Fair   Clinical Impairments Affecting Rehab Potential TBI, family support   PT Frequency 1x / week   PT Duration 12 weeks   PT Treatment/Interventions Manual techniques;Wheelchair mobility training;Therapeutic activities;ADLs/Self Care Home Management;Functional mobility training;Therapeutic exercise   PT Next Visit Plan Continue with cervical STM, stretching, and active cervical motion   PT Home Exercise Plan neck stretching   Consulted and Agree with Plan of Care Patient;Family member/caregiver      Patient will benefit from skilled therapeutic intervention in order to improve the following deficits and impairments:  Decreased range of motion, Impaired perceived functional ability, Impaired UE functional use, Improper body mechanics  Visit Diagnosis: Muscle weakness (generalized)  Stiffness of cervical spine     Problem List Patient Active Problem List   Diagnosis Date Noted  . Sepsis (HCC) 08/19/2015    Myrene Galas, PT DPT 10/24/2016, 11:00 AM  [place] [facility] SPORTS MEDICINE [address] [place], [address] Phone: [phone]   Fax:  [phone]  Name: Jacob Green MRN: [id number] Date of Birth: Jan 29, 1966

## 2016-11-07 ENCOUNTER — Ambulatory Visit: Payer: Medicare Other | Attending: Neurology

## 2016-11-07 DIAGNOSIS — M436 Torticollis: Secondary | ICD-10-CM

## 2016-11-07 DIAGNOSIS — M6281 Muscle weakness (generalized): Secondary | ICD-10-CM | POA: Diagnosis not present

## 2016-11-07 NOTE — Therapy (Signed)
[facility] [address] [place], [place], [address] Phone: [phone]   Fax:  [phone]  Physical Therapy Treatment  Patient Details  Name: Jacob HammanSteven G Collantes MRN: [id number] Date of Birth: 06/07/1965 Referring Provider: Malvin JohnsPotter  Encounter Date: 11/07/2016      PT End of Session - 11/07/16 1331    Visit Number 16   Number of Visits 29   Date for PT Re-Evaluation 12/19/16   Authorization Type g code   Authorization Time Period 6   PT Start Time 0925   PT Stop Time 1003   PT Time Calculation (min) 38 min   Activity Tolerance Patient tolerated treatment well   Behavior During Therapy [facility] for tasks assessed/performed      Past Medical History:  Diagnosis Date  . Closed TBI (traumatic brain injury) (HCC) approximately 40 yrs ago  . Glaucoma   . Seizures (HCC)   . Urinary tract infection     History reviewed. No pertinent surgical history.  There were no vitals filed for this visit.      Subjective Assessment - 11/07/16 1317    Subjective Patient's mother reports she is more easily able to lift Jacob Green's head to perform ADLs. However she is concerned by his decreased neck strength and would like to address that during this visit. Patient reports she would like to look into performing electrical stimulation.    Patient is accompained by: Family member   Pertinent History TBI, chronic dystonia.   Patient Stated Goals Mother would like to be able to feed pt, wash pt, and shaving/brushing teeth.   Currently in Pain? No/denies         TREATMENT:  Manual therapy:  Performed STM along left paraspinal, upper traps, SCM, scalenes cervical extensors/rotators, levator scap, and periscapular muscular with the patient in sitting. Focused today on performing STM to patient's deep cervical extension as he demonstrates increased tone in the area Performed prolonged stretching to decrease tone by performingcervical  rotation to the right and cervical lateral flexion to the right -- stretching held 2 x 15min.   Therapeutic Exercise: Patient held his head straight with tactile cueing and minA for 30 sec x 10and constant tactile and verbal cueing required to maintain erect head posture. Performed tactile cueing to patient's lateral aspect of the right side of his head, neckand right shoulder to direct patient action when holding up erect.   Patient able to achieve (with PT assist) R side bending of 5-7 deg head positioning after cueing and manual stretching and soft tissue. Patient able to achieve position with mod-maxA from PT; Patient demonstrates improvement with cervical motion after performing STM; Patient demonstrates increased redness along L skin folds along lateral neck today.  Patient demonstrates improvement in cervical AROM at end of session.           PT Education - 11/07/16 1327    Education provided Yes   Education Details Educated mother on palpation techniques   Person(s) Educated Patient   Methods Explanation;Demonstration   Comprehension Verbalized understanding;Returned demonstration             PT Long Term Goals - 09/26/16 1123      PT LONG TERM GOAL #1   Title Pt will demonstrate improved cervical posture as seen by decr. in line of redness in cervical region from 8" to less than 4" to decr. risk for skin breakdown   Baseline 07/04/16: 5" 08/01/16: 5"; 09/26/16: 5"  Time 12   Period Weeks   Status On-going     PT LONG TERM GOAL #2   Title Pt will be able to achieve 10 deg. cervical lateral flexion to R indicating improvement in motion to decr. strain on caregivers with ADLs   Baseline able to achieve -5 deg. to neutral; 07/04/16: 2 degrees past neutral; 08/01/16: 3 degrees past neutral 09/26/2016: 5 deg from neutral   Time 12   Period Weeks   Status On-going               Plan - 11/07/16 1333    Clinical Impression Statement Patient demonstrates improvement  in cervical positioning with exercises/manual therapy indicating functional improvement. Patient continues to demonstrate increased cervical weakness and increased tone resulting in poor cervical posture. Focused greater on holding cervical positions to improve muscular strength. Patient will benefit from further skilled therapy to improve cervical positioning and strengtheing to allow for performance of ADLs.    Rehab Potential Fair   Clinical Impairments Affecting Rehab Potential TBI, family support   PT Frequency 1x / week   PT Duration 12 weeks   PT Treatment/Interventions Manual techniques;Wheelchair mobility training;Therapeutic activities;ADLs/Self Care Home Management;Functional mobility training;Therapeutic exercise   PT Next Visit Plan Continue with cervical STM, stretching, and active cervical motion   PT Home Exercise Plan neck stretching   Consulted and Agree with Plan of Care Patient;Family member/caregiver      Patient will benefit from skilled therapeutic intervention in order to improve the following deficits and impairments:  Decreased range of motion, Impaired perceived functional ability, Impaired UE functional use, Improper body mechanics  Visit Diagnosis: Muscle weakness (generalized)  Stiffness of cervical spine  Muscle weakness     Problem List Patient Active Problem List   Diagnosis Date Noted  . Sepsis (HCC) 08/19/2015    Myrene Galas, PT DPT 11/07/2016, 1:49 PM  [place] [facility] PHYS[facility] AND SPORTS MEDICINE [address], [place], [address] Phone: [phone]   Fax:  [phone]  Name: Jacob RYSER MRN: [id number] Date of Birth: [birth year]

## 2016-11-21 ENCOUNTER — Ambulatory Visit: Payer: Medicare Other

## 2016-11-21 DIAGNOSIS — M6281 Muscle weakness (generalized): Secondary | ICD-10-CM

## 2016-11-21 DIAGNOSIS — M436 Torticollis: Secondary | ICD-10-CM

## 2016-11-21 NOTE — Therapy (Signed)
[place] [facility]ORTS MEDICINE [address] [place], [address] Phone: [phone]   Fax:  [phone]  Physical Therapy Treatment  Patient Details  Name: Jacob Green MRN: [id number] Date of Birth: [birth year] Referring Provider: Malvin Johns  Encounter Date: 11/21/2016      PT End of Session - 11/21/16 1135    Visit Number 17   Number of Visits 29   Date for PT Re-Evaluation 12/19/16   Authorization Type g code   Authorization Time Period 7   PT Start Time 0925   PT Stop Time 1005   PT Time Calculation (min) 40 min   Activity Tolerance Patient tolerated treatment well   Behavior During Therapy [facility] for tasks assessed/performed      Past Medical History:  Diagnosis Date  . Closed TBI (traumatic brain injury) (HCC) approximately 40 yrs ago  . Glaucoma   . Seizures (HCC)   . Urinary tract infection     History reviewed. No pertinent surgical history.  There were no vitals filed for this visit.      Subjective Assessment - 11/21/16 1132    Subjective Patient's mother reports Jacob Green has been holding his head more erect throguhout the day and has been able to perform greater amount of exercises with Jacob Green throughout the day. Patient's mother reports no major changes otherwise.    Patient is accompained by: Family member   Pertinent History TBI, chronic dystonia.   Patient Stated Goals Mother would like to be able to feed pt, wash pt, and shaving/brushing teeth.   Currently in Pain? No/denies          TREATMENT:   Manual therapy:  Performed STM along left paraspinal, upper traps, scalenes cervical extensors/rotators, levator scap, and periscapular muscular with the patient in sitting. Focused today on performing STM to patient's scalenes today as he demonstrates increased tone in the area Performed prolonged stretching to decrease tone by performing cervical rotation to the right and cervical lateral flexion to the  right -- stretching held 2 x 10 min.    Therapeutic Exercise: Patient held his head straight with tactile cueing and minA for 30 sec x 5 and constant tactile and verbal cueing required to maintain erect head posture.  Cervical lateral flexion with 3 sec holds - 2 x 10  Cervical rotation in sitting L/R with neutral head positioning - 2 x 8  Performed tactile cueing to patient's lateral aspect of the right side of his head, neck and right shoulder to direct patient action when holding up erect.    Patient able to achieve (with PT assist) R side bending of 3 deg head positioning after cueing and manual stretching and soft tissue. Patient able to achieve position with modA from PT; Patient demonstrates improvement with cervical motion after performing STM; Patient demonstrates decreased redness along L skin folds along lateral neck today.   Patient demonstrates improvement in cervical AROM at end of session.           PT Education - 11/21/16 1134    Education provided Yes   Education Details Educated on proper head positioning with performing exercises at home   Person(s) Educated Patient   Methods Explanation;Demonstration   Comprehension Verbalized understanding;Returned demonstration             PT Long Term Goals - 09/26/16 1123      PT LONG TERM GOAL #1   Title Pt will demonstrate improved cervical posture as seen by  decr. in line of redness in cervical region from 8" to less than 4" to decr. risk for skin breakdown   Baseline 07/04/16: 5" 08/01/16: 5"; 09/26/16: 5"   Time 12   Period Weeks   Status On-going     PT LONG TERM GOAL #2   Title Pt will be able to achieve 10 deg. cervical lateral flexion to R indicating improvement in motion to decr. strain on caregivers with ADLs   Baseline able to achieve -5 deg. to neutral; 07/04/16: 2 degrees past neutral; 08/01/16: 3 degrees past neutral 09/26/2016: 5 deg from neutral   Time 12   Period Weeks   Status On-going                Plan - 11/21/16 1137    Clinical Impression Statement Patient demonstrates improvement with performance of cervical exercises with ability to perform exercises without rest. Patient demonstrates ability to perform exercises throughout greater AROM and performs grreater repetitions indicating funcitonal carryover between treatment sessions. Patient continues to have red lining secondary to cervical skin aggravation but they seem to be slighted alleviated compared to previous visits. Patient will benefit from further skiled therapy focused on improving cervical posture and AROM/strength as patient still lacks considerable motion/endurance.    Rehab Potential Fair   Clinical Impairments Affecting Rehab Potential TBI, family support   PT Frequency 1x / week   PT Duration 12 weeks   PT Treatment/Interventions Manual techniques;Wheelchair mobility training;Therapeutic activities;ADLs/Self Care Home Management;Functional mobility training;Therapeutic exercise   PT Next Visit Plan Continue with cervical STM, stretching, and active cervical motion   PT Home Exercise Plan neck stretching   Consulted and Agree with Plan of Care Patient;Family member/caregiver      Patient will benefit from skilled therapeutic intervention in order to improve the following deficits and impairments:  Decreased range of motion, Impaired perceived functional ability, Impaired UE functional use, Improper body mechanics  Visit Diagnosis: Muscle weakness (generalized)  Stiffness of cervical spine  Muscle weakness     Problem List Patient Active Problem List   Diagnosis Date Noted  . Sepsis (HCC) 08/19/2015    Myrene Galas, PT DPT 11/21/2016, 12:02 PM  [facility] PHYSICAL AND SPORTS MEDICINE [address], [place], [address] Phone: [phone]   Fax:  [phone]  Name: TATE ZAGAL MRN: [id number] Date of Birth: 1965-11-30

## 2016-12-05 ENCOUNTER — Ambulatory Visit: Payer: Medicare Other | Attending: Neurology

## 2016-12-05 DIAGNOSIS — M436 Torticollis: Secondary | ICD-10-CM | POA: Insufficient documentation

## 2016-12-05 DIAGNOSIS — M6281 Muscle weakness (generalized): Secondary | ICD-10-CM | POA: Insufficient documentation

## 2016-12-05 NOTE — Therapy (Signed)
[place] [facility] PH[facility]ORTS MEDICINE [address] [place], [address] Phone: [phone]   Fax:  [phone]  Physical Therapy Treatment  Patient Details  Name: Jacob Green MRN: [id number] Date of Birth: [birth year] Referring Provider: Malvin Johns  Encounter Date: 12/05/2016      PT End of Session - 12/05/16 1121    Visit Number 18   Number of Visits 29   Date for PT Re-Evaluation 12/19/16   Authorization Type g code   Authorization Time Period 8   PT Start Time 0923   PT Stop Time 1003   PT Time Calculation (min) 40 min   Activity Tolerance Patient tolerated treatment well   Behavior During Therapy [facility] for tasks assessed/performed      Past Medical History:  Diagnosis Date  . Closed TBI (traumatic brain injury) (HCC) approximately 40 yrs ago  . Glaucoma   . Seizures (HCC)   . Urinary tract infection     History reviewed. No pertinent surgical history.  There were no vitals filed for this visit.      Subjective Assessment - 12/05/16 1118    Subjective Patient's mother reports she has been asking Ladislav's caretakers to try to keep his head in a more extended position. Patient's mother reports she continues to attempt to perform exercises at home and that the PT has been helping with performance of ADLs such as shaving and brushing the patient's teeth.    Patient is accompained by: Family member   Pertinent History TBI, chronic dystonia.   Patient Stated Goals Mother would like to be able to feed pt, wash pt, and shaving/brushing teeth.   Currently in Pain? No/denies      TREATMENT:  Manual therapy:  Performed STM along left paraspinal, upper traps, scalenes cervical extensors/rotators, levator scap, and periscapular muscular with the patient in sitting. Focused today on performing STM to patient's scalenes and SCM today as he demonstrates increased tone in the area  Performed prolonged stretching to decrease tone by  performingcervical rotation to the right and cervical lateral flexion to the right -- stretching held 4 x .   Therapeutic Exercise: Patient held his head straight with tactile cueing and minA for 30 sec x 4and constant tactile and verbal cueing required to maintain erect head posture.  Cervical lateral flexion with 10 sec holds -  x 10   Performed tactile cueing to patient's lateral aspect of the right side of his head, neckand right shoulder to direct patient action when holding up erect.   Patient able to achieve (with PT assist) R side bending of 5deg head positioning after cueing and manual stretching and soft tissue. Patient able to achieve position with modA from PT; Patient demonstrates improvement with cervical motion after performing STM; Patient demonstrates decreased redness along L skin folds along lateral neck today.  Patient demonstrates improvement in cervical AROM at end of session.        PT Education - 12/05/16 1120    Education provided Yes   Education Details Proper head positioning with exercise performance   Person(s) Educated Patient   Methods Explanation;Demonstration   Comprehension Verbalized understanding;Returned demonstration             PT Long Term Goals - 09/26/16 1123      PT LONG TERM GOAL #1   Title Pt will demonstrate improved cervical posture as seen by decr. in line of redness in cervical region from 8" to less than 4" to  decr. risk for skin breakdown   Baseline 07/04/16: 5" 08/01/16: 5"; 09/26/16: 5"   Time 12   Period Weeks   Status On-going     PT LONG TERM GOAL #2   Title Pt will be able to achieve 10 deg. cervical lateral flexion to R indicating improvement in motion to decr. strain on caregivers with ADLs   Baseline able to achieve -5 deg. to neutral; 07/04/16: 2 degrees past neutral; 08/01/16: 3 degrees past neutral 09/26/2016: 5 deg from neutral   Time 12   Period Weeks   Status On-going               Plan -  12/05/16 1122    Clinical Impression Statement Patient demonstrates improvement with performance of cervical exercises and positioning throughout treatment today. Patient continues to demonstrate increased redness along the lateral aspect of the neck but demonstrates less redness compared to previous visitation sessions indicating functional carryover. Patient able to perform R lateral side bending via the cervical joints to around 10 deg from neutral witout aide from PT assistance which is an improvement compared to previous visits.    Rehab Potential Fair   Clinical Impairments Affecting Rehab Potential TBI, family support   PT Frequency 1x / week   PT Duration 12 weeks   PT Treatment/Interventions Manual techniques;Wheelchair mobility training;Therapeutic activities;ADLs/Self Care Home Management;Functional mobility training;Therapeutic exercise   PT Next Visit Plan Continue with cervical STM, stretching, and active cervical motion   PT Home Exercise Plan neck stretching   Consulted and Agree with Plan of Care Patient;Family member/caregiver      Patient will benefit from skilled therapeutic intervention in order to improve the following deficits and impairments:  Decreased range of motion, Impaired perceived functional ability, Impaired UE functional use, Improper body mechanics  Visit Diagnosis: Muscle weakness (generalized)  Stiffness of cervical spine  Muscle weakness     Problem List Patient Active Problem List   Diagnosis Date Noted  . Sepsis (HCC) 08/19/2015    Myrene Galas, PT DPT 12/05/2016, 11:36 AM  [place] [facility] PHYSICAL AND SPORTS MEDICINE [address], [place], [address] Phone: [phone]   Fax:  [phone]  Name: MAINOR HELLMANN MRN: [id number] Date of Birth: 1965-08-12

## 2016-12-19 ENCOUNTER — Ambulatory Visit: Payer: Medicare Other

## 2016-12-30 IMAGING — DX DG CHEST 1V PORT
1 series · 1 of 1 positions shown · non-contrast
Comparison: None.

CLINICAL DATA: Fevers

EXAM:
PORTABLE CHEST 1 VIEW

[chest ap]
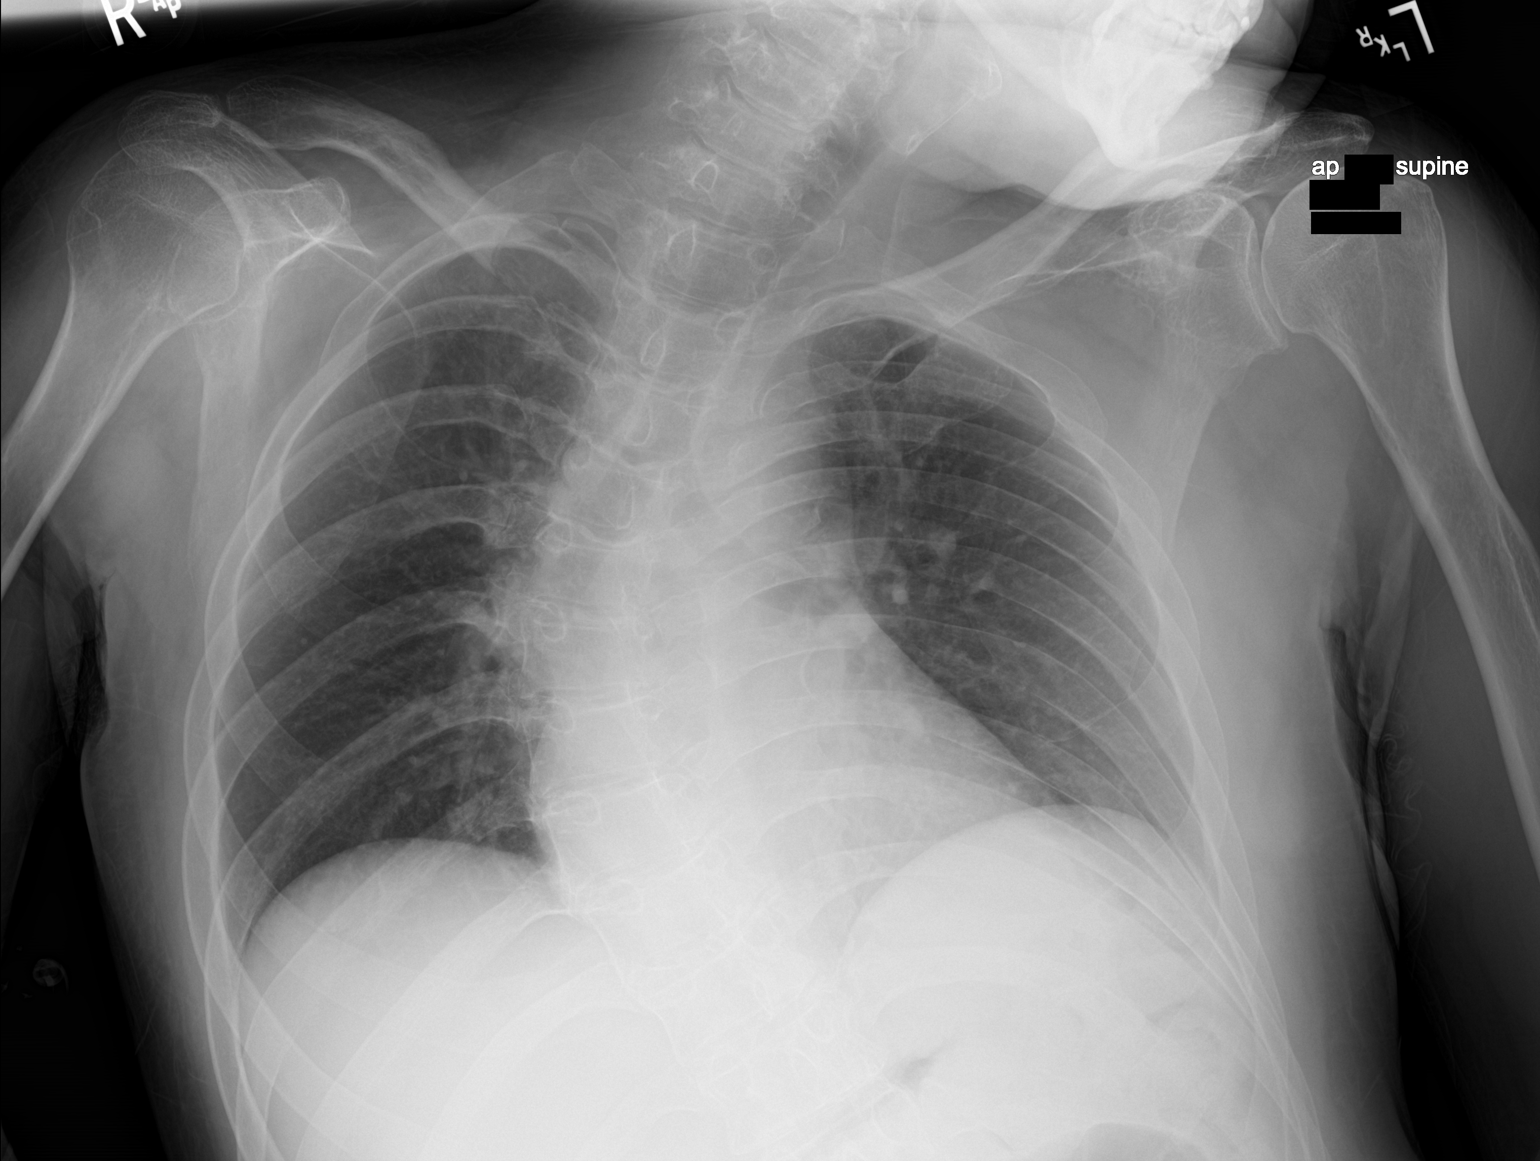

[1 of 1 positions shown; findings below may reference images not displayed]

FINDINGS: The heart size and mediastinal contours are within normal limits.
Both lungs are clear. The visualized skeletal structures show
scoliosis concave to the left
IMPRESSION: No active disease.

## 2017-01-02 ENCOUNTER — Ambulatory Visit: Payer: Medicare Other

## 2017-01-02 DIAGNOSIS — M6281 Muscle weakness (generalized): Secondary | ICD-10-CM | POA: Diagnosis not present

## 2017-01-02 DIAGNOSIS — M436 Torticollis: Secondary | ICD-10-CM

## 2017-01-02 NOTE — Therapy (Signed)
[place] [facility] PH[facility] SPORTS MEDICINE [address] [place], [address] Phone: [phone]   Fax:  [phone]  Physical Therapy Treatment  Patient Details  Name: Jacob Green MRN: [id number] Date of Birth: 11-15-1965 Referring Provider: Malvin Johns  Encounter Date: 01/02/2017      PT End of Session - 01/02/17 1223    Visit Number 19   Number of Visits 29   Date for PT Re-Evaluation 03/27/17   Authorization Type g code   Authorization Time Period 9   PT Start Time 0920   PT Stop Time 1000   PT Time Calculation (min) 40 min   Activity Tolerance Patient tolerated treatment well   Behavior During Therapy [facility] for tasks assessed/performed      Past Medical History:  Diagnosis Date  . Closed TBI (traumatic brain injury) (HCC) approximately 40 yrs ago  . Glaucoma   . Seizures (HCC)   . Urinary tract infection     History reviewed. No pertinent surgical history.  There were no vitals filed for this visit.      Subjective Assessment - 01/02/17 1014    Subjective Patient mother reports she feels the redness on the later side of the neck has decreased over time. Patient's mother reports function has been improving and still has increased  limitations especially secondary to having to miss there last appointment.    Patient is accompained by: Family member   Pertinent History TBI, chronic dystonia.   Patient Stated Goals Mother would like to be able to feed pt, wash pt, and shaving/brushing teeth.   Currently in Pain? No/denies      TREATMENT:   Manual therapy:  Performed STM along left paraspinal, upper traps, scalenes cervical extensors/rotators, levator scap, SCM and periscapular muscular with the patient in sitting as he demonstrates increased tone in the area   Performed prolonged stretching to decrease tone by performing cervical rotation to the right and cervical lateral flexion to the right -- stretching held 3 x 10 min.     Therapeutic Exercise: Patient held his head straight with tactile cueing and minA for 30 sec x 3 and constant tactile and verbal cueing required to maintain erect head posture; most notably with extension and R side bending.10   Performed tactile cueing to patient's lateral aspect of the right side of his head, neck and right shoulder to direct patient action when holding up erect.    Patient able to achieve (with PT assist) R side bending of 7 deg head positioning after cueing and manual stretching and soft tissue. Patient able to achieve position with modA from PT; Patient demonstrates improvement with cervical motion after performing STM; Patient demonstrates decreased redness along L skin folds along lateral neck today.   Patient demonstrates improvement in cervical AROM at end of session.         PT Education - 01/02/17 1223    Education provided Yes   Education Details Proper head positioning and education to patient's mom    Person(s) Educated Patient   Methods Explanation;Demonstration   Comprehension Returned demonstration;Verbalized understanding             PT Long Term Goals - 01/02/17 1225      PT LONG TERM GOAL #1   Title Pt will demonstrate improved cervical posture as seen by decr. in line of redness in cervical region from 8" to less than 4" to decr. risk for skin breakdown   Baseline 07/04/16: 5" 08/01/16:  5"; 09/26/16: 5"; 01/02/2017: 5" (lighter in color then previous visit)   Time 12   Period Weeks   Status On-going     PT LONG TERM GOAL #2   Title Pt will be able to achieve 10 deg. cervical lateral flexion to R indicating improvement in motion to decr. strain on caregivers with ADLs   Baseline able to achieve -5 deg. to neutral; 07/04/16: 2 degrees past neutral; 08/01/16: 3 degrees past neutral 09/26/2016: 5 deg from neutral; 01/02/17: 5 deg past neutral   Time 12   Period Weeks   Status On-going               Plan - 01/02/17 1227    Clinical  Impression Statement Patient is making progress towards long term goals with functional carryover and improvement with lateral side flexion with exercises. Patient continues demonstrate improvement in cervical lateral flexion; Patient demonstrates decreased redness with redeness marks on the lateral aspect of his neck indicating functional carryover. Patient continues to have decreased AROM with cervical side flexion but is improving PROM. Patient will benefit from further skilled therapy to return to prior level of function.     Rehab Potential Fair   Clinical Impairments Affecting Rehab Potential TBI, family support   PT Frequency 1x / week   PT Duration 12 weeks   PT Treatment/Interventions Manual techniques;Wheelchair mobility training;Therapeutic activities;ADLs/Self Care Home Management;Functional mobility training;Therapeutic exercise   PT Next Visit Plan Continue with cervical STM, stretching, and active cervical motion   PT Home Exercise Plan neck stretching   Consulted and Agree with Plan of Care Patient;Family member/caregiver      Patient will benefit from skilled therapeutic intervention in order to improve the following deficits and impairments:  Decreased range of motion, Impaired perceived functional ability, Impaired UE functional use, Improper body mechanics  Visit Diagnosis: Muscle weakness (generalized)  Stiffness of cervical spine     Problem List Patient Active Problem List   Diagnosis Date Noted  . Sepsis (HCC) 08/19/2015    Myrene GalasWesley Sible Straley, PT DPT 01/02/2017, 12:33 PM  [place] [facility] [address] [place], [place], [address] Phone: [phone]   Fax:  [phone]  Name: Jacob Green MRN: [id number] Date of Birth: 12/16/1965

## 2017-01-02 NOTE — Addendum Note (Signed)
Addended by: Bethanie DickerISSELL, Elmore V on: 01/02/2017 12:38 PM   Modules accepted: Orders

## 2017-01-16 ENCOUNTER — Ambulatory Visit: Payer: Medicare Other | Attending: Neurology

## 2017-01-16 DIAGNOSIS — M6281 Muscle weakness (generalized): Secondary | ICD-10-CM | POA: Diagnosis present

## 2017-01-16 DIAGNOSIS — M436 Torticollis: Secondary | ICD-10-CM | POA: Insufficient documentation

## 2017-01-16 NOTE — Therapy (Signed)
[place] [facility] [address] [place], [place], [address] Phone: [phone]   Fax:  [phone]  Physical Therapy Treatment  Patient Details  Name: Jacob HammanSteven G Curt MRN: [id number] Date of Birth: 06/10/1965 Referring Provider: Malvin JohnsPotter   Encounter Date: 01/16/2017  PT End of Session - 01/16/17 1158    Visit Number  20    Number of Visits  29    Date for PT Re-Evaluation  03/27/17    Authorization Type  g code    Authorization Time Period  10    PT Start Time  0922    PT Stop Time  1000    PT Time Calculation (min)  38 min    Activity Tolerance  Patient tolerated treatment well    Behavior During Therapy  [facility] for tasks assessed/performed       Past Medical History:  Diagnosis Date  . Closed TBI (traumatic brain injury) (HCC) approximately 40 yrs ago  . Glaucoma   . Seizures (HCC)   . Urinary tract infection     History reviewed. No pertinent surgical history.  There were no vitals filed for this visit.  Subjective Assessment - 01/16/17 1028    Subjective  Patient's mother reports improvement with moving the patient's head for the performance of ADLS such as shaving and feeding [name]. Patient's mother reports she continues to have diffficulty with moving the patient's neck in the morning and performing having paient perform lateral side flexion.     Patient is accompained by:  Family member    Pertinent History  TBI, chronic dystonia.    Patient Stated Goals  Mother would like to be able to feed pt, wash pt, and shaving/brushing teeth.    Currently in Pain?  No/denies       TREATMENT:   Manual therapy:  Performed STM along left paraspinal, upper traps, scalenes cervical extensors/rotators, levator scap, SCM and periscapular muscular with the patient in sitting as he demonstrates increased tone in the area   Performed prolonged stretching to decrease tone by performing cervical rotation to the right and  cervical lateral flexion to the right -- stretching held 4 x 7 min.    Therapeutic Exercise: Patient held his head straight with tactile cueing and minA for 30 sec x 5 and constant tactile and verbal cueing required to maintain erect head posture; most notably with extension and R side bending.  Performed tactile cueing to patient's lateral aspect of the right side of his head, neck and right shoulder to direct patient action when holding up erect.    Patient able to achieve (with PT assist) R side bending of 10 deg head positioning after cueing and manual stretching and soft tissue. Patient able to achieve position with modA from PT; Patient demonstrates improvement with cervical motion after performing STM; Patient demonstrates decreased redness along L skin folds along lateral neck today.   Patient demonstrates improvement in cervical AROM at end of session.     PT Education - 01/16/17 1158    Education provided  Yes    Education Details  Reviewed technique for head positioning and manual therapy techniques to be used at home by mother    Person(s) Educated  Patient    Methods  Explanation;Demonstration;Verbal cues;Tactile cues    Comprehension  Verbalized understanding;Returned demonstration          PT Long Term Goals - 01/16/17 1202      PT LONG TERM GOAL #1  Title  Pt will demonstrate improved cervical posture as seen by decr. in line of redness in cervical region from 8" to less than 4" to decr. risk for skin breakdown    Baseline  07/04/16: 5" 08/01/16: 5"; 09/26/16: 5"; 01/02/2017: 5" (lighter in color then previous visit)    Time  12    Period  Weeks    Status  On-going      PT LONG TERM GOAL #2   Title  Pt will be able to achieve 10 deg. cervical lateral flexion to R indicating improvement in motion to decr. strain on caregivers with ADLs    Baseline  able to achieve -5 deg. to neutral; 07/04/16: 2 degrees past neutral; 08/01/16: 3 degrees past neutral 09/26/2016: 5 deg from  neutral; 01/02/17: 5 deg past neutral    Time  12    Period  Weeks    Status  On-going            Plan - 01/16/17 1159    Clinical Impression Statement  Patient continues to make progress towards long term goals with improved ability and speed with perform head positioning with verbal and manual cueing. Patient demonstrates improved ability to position head but continues to require therapist assist to position his head and achieve maximal/ optimal ROM. Patient also continues to show increased redness and skin irratation along the L lateral aspect of his neck and will benefit from further skilled therapy to return to prior level of function.     Rehab Potential  Fair    Clinical Impairments Affecting Rehab Potential  TBI, family support    PT Frequency  1x / week    PT Duration  12 weeks    PT Treatment/Interventions  Manual techniques;Wheelchair mobility training;Therapeutic activities;ADLs/Self Care Home Management;Functional mobility training;Therapeutic exercise    PT Next Visit Plan  Continue with cervical STM, stretching, and active cervical motion    PT Home Exercise Plan  neck stretching    Consulted and Agree with Plan of Care  Patient;Family member/caregiver       Patient will benefit from skilled therapeutic intervention in order to improve the following deficits and impairments:  Decreased range of motion, Impaired perceived functional ability, Impaired UE functional use, Improper body mechanics  Visit Diagnosis: Muscle weakness (generalized)  Stiffness of cervical spine   G-Codes - 01/16/17 1202    Functional Assessment Tool Used (Outpatient Only)  Posture, ADL participation (feeding)    Functional Limitation  Changing and maintaining body position    Changing and Maintaining Body Position Current Status ([id number])  At least 1 percent but less than 20 percent impaired, limited or restricted    Changing and Maintaining Body Position Goal Status (S0109(G8982)  At least 1 percent  but less than 20 percent impaired, limited or restricted       Problem List Patient Active Problem List   Diagnosis Date Noted  . Sepsis (HCC) 08/19/2015    Myrene GalasWesley Swayze Kozuch, PT DPT 01/16/2017, 12:03 PM  [place] [facility] [address] , [place], [address] Phone: [phone]   Fax:  [phone]  Name: Jacob HammanSteven G Sorter MRN: [id number] Date of Birth: 06/30/1965

## 2017-01-30 ENCOUNTER — Ambulatory Visit: Payer: Medicare Other

## 2017-01-30 DIAGNOSIS — M6281 Muscle weakness (generalized): Secondary | ICD-10-CM

## 2017-01-30 DIAGNOSIS — M436 Torticollis: Secondary | ICD-10-CM

## 2017-01-30 NOTE — Therapy (Signed)
[place] [facility] [address] [place], [place], [address] Phone: [phone]   Fax:  [phone]  Physical Therapy Treatment  Patient Details  Name: Jacob Green MRN: [id number] Date of Birth: 03/03/1966 Referring Provider: Malvin JohnsPotter   Encounter Date: 01/30/2017  PT End of Session - 01/30/17 1014    Visit Number  21    Number of Visits  29    Date for PT Re-Evaluation  03/27/17    Authorization Type  g code    Authorization Time Period  1    PT Start Time  0918    PT Stop Time  1000    PT Time Calculation (min)  42 min    Activity Tolerance  Patient tolerated treatment well    Behavior During Therapy  [facility] for tasks assessed/performed       Past Medical History:  Diagnosis Date  . Closed TBI (traumatic brain injury) (HCC) approximately 40 yrs ago  . Glaucoma   . Seizures (HCC)   . Urinary tract infection     History reviewed. No pertinent surgical history.  There were no vitals filed for this visit.  Subjective Assessment - 01/30/17 1009    Subjective  Patien'ts mother reports Jacob Green is able to keep his head much more erect at rest and is able to acheive greater amounts of cervical side bending with only minor tactile cueing to initiate cervical movement. Patient's mother reports she been performing exercises at home with Jacob Green.     Patient is accompained by:  Family member    Pertinent History  TBI, chronic dystonia.    Patient Stated Goals  Mother would like to be able to feed pt, wash pt, and shaving/brushing teeth.    Currently in Pain?  No/denies       TREATMENT:  Manual therapy:  Performed STM along left paraspinal, upper traps, scalenes cervical extensors/rotators, levator scap, SCM and periscapular muscular with the patient in sitting as he demonstrates increased tone in the area  Performed prolonged stretching to decrease tone by performingcervical rotation to the right and cervical lateral  flexion to the right -- stretching held 4x 7min.    Patient able to achieve (with PT assist) R side bending of 10deg head positioning after cueing and manual stretching and soft tissue. Patient able to achieve position with modA from PT; Patient demonstrates improvement with cervical motion after performing STM; Patient demonstrates decreased redness along L skin folds along lateral neck today.  Patient demonstrates improvement in cervical AROM at end of session.      PT Education - 01/30/17 1014    Education provided  Yes    Education Details  Proper technique with head positioning    Person(s) Educated  Patient    Methods  Explanation;Demonstration    Comprehension  Verbalized understanding;Returned demonstration          PT Long Term Goals - 01/16/17 1202      PT LONG TERM GOAL #1   Title  Pt will demonstrate improved cervical posture as seen by decr. in line of redness in cervical region from 8" to less than 4" to decr. risk for skin breakdown    Baseline  07/04/16: 5" 08/01/16: 5"; 09/26/16: 5"; 01/02/2017: 5" (lighter in color then previous visit)    Time  12    Period  Weeks    Status  On-going      PT LONG TERM GOAL #2   Title  Pt  will be able to achieve 10 deg. cervical lateral flexion to R indicating improvement in motion to decr. strain on caregivers with ADLs    Baseline  able to achieve -5 deg. to neutral; 07/04/16: 2 degrees past neutral; 08/01/16: 3 degrees past neutral 09/26/2016: 5 deg from neutral; 01/02/17: 5 deg past neutral    Time  12    Period  Weeks    Status  On-going            Plan - 01/30/17 1015    Clinical Impression Statement  Patient demonstrates improvement in skin irratation along the lateral aspect of his neck. Patient demonstrates greater ability to initiate movement and is able to perform side bending with light tactile cueing to the area. Patient continues to demonstrate decreased side bending AROM and will benefit from further  skilled therapy to return to prior level of function.     Rehab Potential  Fair    Clinical Impairments Affecting Rehab Potential  TBI, family support    PT Frequency  1x / week    PT Duration  12 weeks    PT Treatment/Interventions  Manual techniques;Wheelchair mobility training;Therapeutic activities;ADLs/Self Care Home Management;Functional mobility training;Therapeutic exercise    PT Next Visit Plan  Continue with cervical STM, stretching, and active cervical motion    PT Home Exercise Plan  neck stretching    Consulted and Agree with Plan of Care  Patient;Family member/caregiver       Patient will benefit from skilled therapeutic intervention in order to improve the following deficits and impairments:  Decreased range of motion, Impaired perceived functional ability, Impaired UE functional use, Improper body mechanics  Visit Diagnosis: Muscle weakness  Stiffness of cervical spine  Muscle weakness (generalized)     Problem List Patient Active Problem List   Diagnosis Date Noted  . Sepsis (HCC) 08/19/2015    Myrene GalasWesley Anniebell Bedore, PT DPT 01/30/2017, 10:21 AM  [place] [facility] [address] [place], [place], [address] Phone: [phone]   Fax:  [phone]  Name: Jacob Green MRN: [id number] Date of Birth: 10/06/1965

## 2017-02-18 ENCOUNTER — Ambulatory Visit: Payer: Medicare Other | Attending: Neurology

## 2017-02-18 DIAGNOSIS — M436 Torticollis: Secondary | ICD-10-CM | POA: Diagnosis present

## 2017-02-18 DIAGNOSIS — M6281 Muscle weakness (generalized): Secondary | ICD-10-CM

## 2017-02-18 NOTE — Therapy (Signed)
[place] [facility] [address] [place], [place], [address] Phone: [phone]   Fax:  [phone]  Physical Therapy Treatment  Patient Details  Name: Jacob HammanSteven G Green MRN: [id number] Date of Birth: 08/26/1965 Referring Provider: Malvin JohnsPotter   Encounter Date: 02/18/2017  PT End of Session - 02/18/17 1407    Visit Number  22    Number of Visits  29    Date for PT Re-Evaluation  03/27/17    Authorization Type  g code    Authorization Time Period  2    PT Start Time  0951    PT Stop Time  1030    PT Time Calculation (min)  39 min    Activity Tolerance  Patient tolerated treatment well    Behavior During Therapy  [facility] for tasks assessed/performed       Past Medical History:  Diagnosis Date  . Closed TBI (traumatic brain injury) (HCC) approximately 40 yrs ago  . Glaucoma   . Seizures (HCC)   . Urinary tract infection     History reviewed. No pertinent surgical history.  There were no vitals filed for this visit.  Subjective Assessment - 02/18/17 1237    Subjective  Patient's mother reports she has been performing light STM to address tissue elasticity. Patient's mother states she feels therapy is really helping with Jacob Green's function.     Patient is accompained by:  Family member    Pertinent History  TBI, chronic dystonia.    Patient Stated Goals  Mother would like to be able to feed pt, wash pt, and shaving/brushing teeth.    Currently in Pain?  No/denies       TREATMENT:   Manual therapy:  Performed STM along left paraspinal, upper traps, scalenes cervical extensors/rotators, levator scap, SCM and periscapular muscular with the patient in sitting as he demonstrates increased tone in the area   Performed prolonged stretching to decrease tone by performing cervical rotation to the right and cervical lateral flexion to the right -- stretching held 2 x 6 min.   Therapeutic Exercise: Patient held his head straight with  tactile cueing and minA for 30 sec x 5 and constant tactile and verbal cueing required to maintain erect head posture; most notably with extension and R side bending.  Performed tactile cueing to patient's lateral aspect of the right side of his head, neck and right shoulder to direct patient action when holding up erect.     Patient able to achieve (with PT assist) R side bending of  deg head positioning after cueing and manual stretching and soft tissue. Patient able to achieve position with modA from PT; Patient demonstrates improvement with cervical motion after performing STM; Patient demonstrates decreased redness along L skin folds along lateral neck today.   Patient demonstrates improvement in cervical AROM at end of session.     PT Education - 02/18/17 1406    Education provided  Yes    Education Details  Head positioning with active exercise    Person(s) Educated  Patient    Methods  Explanation;Demonstration    Comprehension  Verbalized understanding;Returned demonstration          PT Long Term Goals - 01/16/17 1202      PT LONG TERM GOAL #1   Title  Pt will demonstrate improved cervical posture as seen by decr. in line of redness in cervical region from 8" to less than 4" to decr. risk for skin breakdown  Baseline  07/04/16: 5" 08/01/16: 5"; 09/26/16: 5"; 01/02/2017: 5" (lighter in color then previous visit)    Time  12    Period  Weeks    Status  On-going      PT LONG TERM GOAL #2   Title  Pt will be able to achieve 10 deg. cervical lateral flexion to R indicating improvement in motion to decr. strain on caregivers with ADLs    Baseline  able to achieve -5 deg. to neutral; 07/04/16: 2 degrees past neutral; 08/01/16: 3 degrees past neutral 09/26/2016: 5 deg from neutral; 01/02/17: 5 deg past neutral    Time  12    Period  Weeks    Status  On-going            Plan - 02/18/17 1410    Clinical Impression Statement  Patient demonstrates improvement with head  positioning with exercises. Patient demonstrates improvement with lateral cervical flexion with AROM and requires tactile and verbal cueing to perform. Patient continue to demonstrates decreased AROM and will benefit from further skilled therapy to return to prior level of funciton.     Rehab Potential  Fair    Clinical Impairments Affecting Rehab Potential  TBI, family support    PT Frequency  1x / week    PT Duration  12 weeks    PT Treatment/Interventions  Manual techniques;Wheelchair mobility training;Therapeutic activities;ADLs/Self Care Home Management;Functional mobility training;Therapeutic exercise    PT Next Visit Plan  Continue with cervical STM, stretching, and active cervical motion    PT Home Exercise Plan  neck stretching    Consulted and Agree with Plan of Care  Patient;Family member/caregiver       Patient will benefit from skilled therapeutic intervention in order to improve the following deficits and impairments:  Decreased range of motion, Impaired perceived functional ability, Impaired UE functional use, Improper body mechanics  Visit Diagnosis: Muscle weakness  Stiffness of cervical spine  Muscle weakness (generalized)     Problem List Patient Active Problem List   Diagnosis Date Noted  . Sepsis (HCC) 08/19/2015    Myrene GalasWesley Naziya Hegwood, PT DPT 02/18/2017, 2:49 PM  [place] [facility] [address] [place], [place], [address] Phone: [phone]   Fax:  [phone]  Name: Jacob HammanSteven G Green MRN: [id number] Date of Birth: 05/16/1965

## 2017-02-27 ENCOUNTER — Encounter: Payer: Medicare Other | Admitting: Physical Therapy

## 2017-03-06 ENCOUNTER — Ambulatory Visit: Payer: Medicare Other | Attending: Neurology

## 2017-03-06 DIAGNOSIS — M6281 Muscle weakness (generalized): Secondary | ICD-10-CM

## 2017-03-06 DIAGNOSIS — M436 Torticollis: Secondary | ICD-10-CM

## 2017-03-06 NOTE — Therapy (Signed)
[facility] [address] [place], [address] Phone: [phone]   Fax:  [phone]  Physical Therapy Treatment  Patient Details  Name: Jacob Green MRN: [id number] Date of Birth: 1965-08-13 Referring Provider: Malvin Green   Encounter Date: 03/06/2017  PT End of Session - 03/06/17 1302    Visit Number  23    Number of Visits  29    Date for PT Re-Evaluation  03/27/17    Authorization Type  g code    Authorization Time Period  3    PT Start Time  0915    PT Stop Time  1000    PT Time Calculation (min)  45 min    Activity Tolerance  Patient tolerated treatment well    Behavior During Therapy  [facility] for tasks assessed/performed       Past Medical History:  Diagnosis Date  . Closed TBI (traumatic brain injury) (HCC) approximately 40 yrs ago  . Glaucoma   . Seizures (HCC)   . Urinary tract infection     History reviewed. No pertinent surgical history.  There were no vitals filed for this visit.  Subjective Assessment - 03/06/17 1259    Subjective  Patient's mother reports Jacob Green's cervical posture continues to improve with performance of ADLs. Patient's mother states Jacob Green has difficulty with bending his cervical spine to the Right side.     Patient is accompained by:  Family member    Pertinent History  TBI, chronic dystonia.    Patient Stated Goals  Mother would like to be able to feed pt, wash pt, and shaving/brushing teeth.    Currently in Pain?  No/denies       TREATMENT:   Manual therapy:  Performed STM along left paraspinal, upper traps, scalenes cervical extensors/rotators, levator scap, SCM and periscapular muscular with the patient in sitting as he demonstrates increased tone in the area   Therapeutic Exercise: Patient held his head erect with slight cervical lateral flexion with tactile cueing and minA for 1.5 sec x 3 and constant tactile and verbal cueing required to maintain erect head  posture; most notably with extension and R side bending.  Performed tactile cueing to patient's lateral aspect of the right side of his head, neck and right shoulder to direct patient action when holding up erect.     Patient demonstrates improvement in cervical AROM at end of session.     PT Education - 03/06/17 1302    Education provided  Yes    Education Details  Tactile cueing for head positioning    Person(s) Educated  Patient    Methods  Explanation;Demonstration    Comprehension  Verbalized understanding;Returned demonstration          PT Long Term Goals - 01/16/17 1202      PT LONG TERM GOAL #1   Title  Pt will demonstrate improved cervical posture as seen by decr. in line of redness in cervical region from 8" to less than 4" to decr. risk for skin breakdown    Baseline  07/04/16: 5" 08/01/16: 5"; 09/26/16: 5"; 01/02/2017: 5" (lighter in color then previous visit)    Time  12    Period  Weeks    Status  On-going      PT LONG TERM GOAL #2   Title  Pt will be able to achieve 10 deg. cervical lateral flexion to R indicating improvement in motion to decr. strain on caregivers with ADLs  Baseline  able to achieve -5 deg. to neutral; 07/04/16: 2 degrees past neutral; 08/01/16: 3 degrees past neutral 09/26/2016: 5 deg from neutral; 01/02/17: 5 deg past neutral    Time  12    Period  Weeks    Status  On-going            Plan - 03/06/17 1303    Clinical Impression Statement  Patient demonstrates improvement with performing cervical lateral rotation and is able to perform increased amount of exercise today before onset of fatigue indicating functional carryover between sessions. Patient demonstrates difficulty with isolating cervical movements and tends to perform excessive cervical rotation with exercises which is improved with tactile cueing. Patient will benefit from further skilled therapy to improve functional capacity.     Rehab Potential  Fair    Clinical Impairments  Affecting Rehab Potential  TBI, family support    PT Frequency  1x / week    PT Duration  12 weeks    PT Treatment/Interventions  Manual techniques;Wheelchair mobility training;Therapeutic activities;ADLs/Self Care Home Management;Functional mobility training;Therapeutic exercise    PT Next Visit Plan  Continue with cervical STM, stretching, and active cervical motion    PT Home Exercise Plan  neck stretching    Consulted and Agree with Plan of Care  Patient;Family member/caregiver       Patient will benefit from skilled therapeutic intervention in order to improve the following deficits and impairments:  Decreased range of motion, Impaired perceived functional ability, Impaired UE functional use, Improper body mechanics  Visit Diagnosis: Muscle weakness  Stiffness of cervical spine  Muscle weakness (generalized)     Problem List Patient Active Problem List   Diagnosis Date Noted  . Sepsis (HCC) 08/19/2015    Jacob Green, PT DPT 03/06/2017, 3:35 PM  [place] [facility] [address] [place], [place], [address] Phone: [phone]   Fax:  [phone]  Name: Jacob HammanSteven G Green MRN: [id number] Date of Birth: 04/01/1965

## 2017-03-20 ENCOUNTER — Ambulatory Visit: Payer: Medicare Other

## 2017-03-20 DIAGNOSIS — M436 Torticollis: Secondary | ICD-10-CM

## 2017-03-20 DIAGNOSIS — M6281 Muscle weakness (generalized): Secondary | ICD-10-CM | POA: Diagnosis not present

## 2017-03-20 NOTE — Therapy (Signed)
[place] [facility] PH[facility] SPORTS MEDICINE [address], [place], [address] Phone: [phone]   Fax:  [phone]  Physical Therapy Treatment  Patient Details  Name: Jacob Green MRN: [id number] Date of Birth: 1965/05/30 No Data Recorded  Encounter Date: 03/20/2017  PT End of Session - 03/20/17 1253    Visit Number  24    Number of Visits  29    Date for PT Re-Evaluation  03/27/17    PT Start Time  0915    PT Stop Time  1000    PT Time Calculation (min)  45 min    Activity Tolerance  Patient tolerated treatment well    Behavior During Therapy  [facility] for tasks assessed/performed       Past Medical History:  Diagnosis Date  . Closed TBI (traumatic brain injury) (HCC) approximately 40 yrs ago  . Glaucoma   . Seizures (HCC)   . Urinary tract infection     History reviewed. No pertinent surgical history.  There were no vitals filed for this visit.  Subjective Assessment - 03/20/17 1109    Subjective  Patient's mother reports Clary's function continues to improve but currently has difficulty with raising his arm overhead and donning his jacket.     Patient is accompained by:  Family member    Pertinent History  TBI, chronic dystonia.    Patient Stated Goals  Mother would like to be able to feed pt, wash pt, and shaving/brushing teeth.    Currently in Pain?  No/denies         TREATMENT:  Manual therapy:  Performed STM along left paraspinal, upper traps, scalenes cervical extensors/rotators, levator scap, SCM and periscapular muscular with the patient in sitting as he demonstrates increased tone in the area  Therapeutic Exercise: Patient held his head erect with slight cervical lateral flexion with tactile cueing and minA for x 3and constant tactile and verbal cueing required to maintain erect head posture; most notably with extension and R side bending. Performed tactile cueing to patient's lateral aspect of the right  side of his head, neckand right shoulder to direct patient action when holding up erect.   Patient demonstrates improvement in cervical AROM at end of session.    PT Education - 03/20/17 1252    Education provided  Yes    Education Details  form/technique and tactile cueing for neck posture    Person(s) Educated  Patient    Methods  Explanation;Demonstration    Comprehension  Verbalized understanding;Returned demonstration          PT Long Term Goals - 01/16/17 1202      PT LONG TERM GOAL #1   Title  Pt will demonstrate improved cervical posture as seen by decr. in line of redness in cervical region from 8" to less than 4" to decr. risk for skin breakdown    Baseline  07/04/16: 5" 08/01/16: 5"; 09/26/16: 5"; 01/02/2017: 5" (lighter in color then previous visit)    Time  12    Period  Weeks    Status  On-going      PT LONG TERM GOAL #2   Title  Pt will be able to achieve 10 deg. cervical lateral flexion to R indicating improvement in motion to decr. strain on caregivers with ADLs    Baseline  able to achieve -5 deg. to neutral; 07/04/16: 2 degrees past neutral; 08/01/16: 3 degrees past neutral 09/26/2016: 5 deg from neutral; 01/02/17: 5 deg  past neutral    Time  12    Period  Weeks    Status  On-going            Plan - 03/20/17 1253    Clinical Impression Statement  Continued to work on improving cervical lateral bending to allow for improvement with functional tasks such as shaving. Patient demonstrates improved ability to perform cervical rotation with commands indicating improvement with motor control. Patient will benefit from further skilled therapy to return to prior level of function.     Rehab Potential  Fair    Clinical Impairments Affecting Rehab Potential  TBI, family support    PT Frequency  1x / week    PT Duration  12 weeks    PT Treatment/Interventions  Manual techniques;Wheelchair mobility training;Therapeutic activities;ADLs/Self Care Home  Management;Functional mobility training;Therapeutic exercise    PT Next Visit Plan  Continue with cervical STM, stretching, and active cervical motion    PT Home Exercise Plan  neck stretching    Consulted and Agree with Plan of Care  Patient;Family member/caregiver       Patient will benefit from skilled therapeutic intervention in order to improve the following deficits and impairments:  Decreased range of motion, Impaired perceived functional ability, Impaired UE functional use, Improper body mechanics  Visit Diagnosis: Muscle weakness  Stiffness of cervical spine  Muscle weakness (generalized)     Problem List Patient Active Problem List   Diagnosis Date Noted  . Sepsis (HCC) 08/19/2015    Myrene GalasWesley Tayquan Gassman, PT DPT 03/20/2017, 1:14 PM  [place] [facility] [address] [place], [place], [address] Phone: [phone]   Fax:  [phone]  Name: Jacob Green MRN: [id number] Date of Birth: 01/21/1966

## 2017-04-03 ENCOUNTER — Ambulatory Visit: Payer: Medicare Other

## 2017-04-03 DIAGNOSIS — M6281 Muscle weakness (generalized): Secondary | ICD-10-CM | POA: Diagnosis not present

## 2017-04-03 DIAGNOSIS — M436 Torticollis: Secondary | ICD-10-CM

## 2017-04-03 NOTE — Addendum Note (Signed)
Addended by: Bethanie DickerISSELL, [place] V on: 04/03/2017 01:40 PM   Modules accepted: Orders

## 2017-04-03 NOTE — Therapy (Signed)
[place] [facility]INE [address] [place], [place], [address] Phone: [phone]   Fax:  [phone]  Physical Therapy Treatment  Patient Details  Name: Jacob Green MRN: [id number] Date of Birth: 04/25/1965 No Data Recorded  Encounter Date: 04/03/2017  PT End of Session - 04/03/17 1321    Visit Number  24    Number of Visits  35    Date for PT Re-Evaluation  06/26/17    PT Start Time  0925    PT Stop Time  1008    PT Time Calculation (min)  43 min    Activity Tolerance  Patient tolerated treatment well    Behavior During Therapy  [facility] for tasks assessed/performed       Past Medical History:  Diagnosis Date  . Closed TBI (traumatic brain injury) (HCC) approximately 40 yrs ago  . Glaucoma   . Seizures (HCC)   . Urinary tract infection     History reviewed. No pertinent surgical history.  There were no vitals filed for this visit.  Subjective Assessment - 04/03/17 1306    Subjective  Patient's mother reports Jacob Green's neck continues to improve and reports he is more interactive and 'lively' after leaving therapy which seems to last a few days. Patient''s mother reports Jacob Green's R shoulder and elbow are restricted in motion and is having difficulty with putting his arms into a jacket.     Patient is accompained by:  Family member    Pertinent History  TBI, chronic dystonia.    Patient Stated Goals  Mother would like to be able to feed pt, wash pt, and shaving/brushing teeth.    Currently in Pain?  No/denies         TREATMENT:  Manual therapy:  Performed STM along left paraspinal, upper traps, scalenes cervical extensors/rotators, levator scap, SCM and periscapular muscular with the patient in sitting as he demonstrates increased tone in the area P-A grade II-III with patient in sitting to T1-6 to improve mobility and decrease pain in sitting   Therapeutic Exercise: Patient held his head erect with slight  cervical lateral flexion with tactile cueing and minA for 3min x 3and constant tactile and verbal cueing required to maintain erect head posture; most notably with extension and R side bending. Performed tactile cueing to patient's lateral aspect of the right side of his head, neckand right shoulder to direct patient action when holding up erect. Performed AAROM cervical rotation in erect posture -- 3 x 20 with therapist assistance to imrove rotational AROM and help decrease muscular spasms and pain  Patient demonstrates improvement in cervical AROM at end of session.  AROM: R shoulder flexion 65deg; R elbow ext: 70 from neutral    PT Education - 04/03/17 1318    Education provided  Yes    Education Details  form/technique with exercise    Person(s) Educated  Patient    Methods  Explanation;Demonstration    Comprehension  Verbalized understanding;Returned demonstration          PT Long Term Goals - 04/03/17 1323      PT LONG TERM GOAL #1   Title  Pt will demonstrate improved cervical posture as seen by decr. in line of redness in cervical region from 8" to less than 4" to decr. risk for skin breakdown    Baseline  07/04/16: 5" 08/01/16: 5"; 09/26/16: 5"; 01/02/2017: 5" (lighter in color then previous visit); 04/03/16: 4" length continued decrease in redness  Time  12    Period  Weeks    Status  On-going      PT LONG TERM GOAL #2   Title  Pt will be able to achieve 10 deg. cervical lateral flexion to R indicating improvement in motion to decr. strain on caregivers with ADLs    Baseline  able to achieve -5 deg. to neutral; 07/04/16: 2 degrees past neutral; 08/01/16: 3 degrees past neutral 09/26/2016: 5 deg from neutral; 01/02/17: 5 deg past neutral; 04/03/17: 8 deg past neutral    Time  12    Period  Weeks    Status  On-going      PT LONG TERM GOAL #3   Title  Patient will improve elbow extension to 30 deg from neutral on the R to allow for improved performance with caregiver to  donn/doff jackets & shirts.     Baseline  70deg from neutral on the R    Time  12    Period  Weeks    Status  New              Patient will benefit from skilled therapeutic intervention in order to improve the following deficits and impairments:     Visit Diagnosis: Muscle weakness  Stiffness of cervical spine  Muscle weakness (generalized)     Problem List Patient Active Problem List   Diagnosis Date Noted  . Sepsis (HCC) 08/19/2015    Myrene Galas, PT DPT 04/03/2017, 1:32 PM  [place] [facility] PHYSICAL AND SPORTS MEDICINE [address], [place], [address] Phone: [phone]   Fax:  [phone]  Name: Jacob Green MRN: [id number] Date of Birth: 1965-11-13

## 2017-04-17 ENCOUNTER — Ambulatory Visit: Payer: Medicare Other | Attending: Neurology

## 2017-04-17 DIAGNOSIS — M6281 Muscle weakness (generalized): Secondary | ICD-10-CM | POA: Insufficient documentation

## 2017-04-17 DIAGNOSIS — M436 Torticollis: Secondary | ICD-10-CM

## 2017-04-17 NOTE — Therapy (Signed)
[place] [facility] [address] [place], [place], [address] Phone: [phone]   Fax:  [phone]  Physical Therapy Treatment  Patient Details  Name: [name] MRN: [id number] Date of Birth: 02/20/1966 No Data Recorded  Encounter Date: 04/17/2017  PT End of Session - 04/17/17 1021    Visit Number  25    Number of Visits  35    Date for PT Re-Evaluation  06/26/17    PT Start Time  0923    PT Stop Time  1004    PT Time Calculation (min)  41 min    Activity Tolerance  Patient tolerated treatment well    Behavior During Therapy  [facility] for tasks assessed/performed       Past Medical History:  Diagnosis Date  . Closed TBI (traumatic brain injury) (HCC) approximately 40 yrs ago  . Glaucoma   . Seizures (HCC)   . Urinary tract infection     History reviewed. No pertinent surgical history.  There were no vitals filed for this visit.  Subjective Assessment - 04/17/17 1017    Subjective  Patient's mother reports Jacob Green's cervical spine continues to improve and has been working on positioning his neck out of cervical lateral side bending onto the unaffected side to decrease skin break down.     Patient is accompained by:  Family member    Pertinent History  TBI, chronic dystonia.    Patient Stated Goals  Mother would like to be able to feed pt, wash pt, and shaving/brushing teeth.    Currently in Pain?  No/denies         TREATMENT:   Manual therapy:  Performed STM along left peri-spinal, upper traps, scalenes cervical extensors/rotators, levator scap, SCM and periscapular muscular with the patient in sitting as he demonstrates increased tone in the area  Performed stretching to the finger flexors, elbow flexors and overhead shoulder flexion performed 2 x 45sec in each position. Patient positioned in sitting during the entirety of the session   Therapeutic Exercise: Patient held his head erect with slight cervical  lateral flexion with tactile cueing and minA for 3min x 2 and constant tactile and verbal cueing required to maintain erect head posture; most notably with extension and R side bending.   Performed tactile cueing to patient's lateral aspect of the right side of his head, neck and right shoulder to direct patient action when holding up erect.  Performed AAROM cervical rotation in erect posture -- 3 x 20 with therapist assistance to imrove rotational AROM and help decrease muscular spasms and pain   Patient demonstrates improvement in cervical AROM at end of session.        PT Education - 04/17/17 1020    Education provided  Yes    Education Details  form/technique with exercise    Person(s) Educated  Patient    Methods  Explanation;Demonstration    Comprehension  Verbalized understanding;Returned demonstration          PT Long Term Goals - 04/03/17 1323      PT LONG TERM GOAL #1   Title  Pt will demonstrate improved cervical posture as seen by decr. in line of redness in cervical region from 8" to less than 4" to decr. risk for skin breakdown    Baseline  07/04/16: 5" 08/01/16: 5"; 09/26/16: 5"; 01/02/2017: 5" (lighter in color then previous visit); 04/03/16: 4" length continued decrease in redness    Time  12  Period  Weeks    Status  On-going      PT LONG TERM GOAL #2   Title  Pt will be able to achieve 10 deg. cervical lateral flexion to R indicating improvement in motion to decr. strain on caregivers with ADLs    Baseline  able to achieve -5 deg. to neutral; 07/04/16: 2 degrees past neutral; 08/01/16: 3 degrees past neutral 09/26/2016: 5 deg from neutral; 01/02/17: 5 deg past neutral; 04/03/17: 8 deg past neutral    Time  12    Period  Weeks    Status  On-going      PT LONG TERM GOAL #3   Title  Patient will improve elbow extension to 30 deg from neutral on the R to allow for improved performance with caregiver to donn/doff jackets & shirts.     Baseline  70deg from neutral on the  R    Time  12    Period  Weeks    Status  New            Plan - 04/17/17 1022    Clinical Impression Statement  Focused on a greater amount of hand, elbow and shoulder movement on the right UE. Patient demonstrates improvement with head positioning and cervical rotation with exercise. Patient continues to demonstrates decreased cervical side bending to the R side. Patient will benefit from further skillled therapy to return to prior level of function.     Rehab Potential  Fair    Clinical Impairments Affecting Rehab Potential  TBI, family support    PT Frequency  1x / week    PT Duration  12 weeks    PT Treatment/Interventions  Manual techniques;Wheelchair mobility training;Therapeutic activities;ADLs/Self Care Home Management;Functional mobility training;Therapeutic exercise    PT Next Visit Plan  Continue with cervical STM, stretching, and active cervical motion    PT Home Exercise Plan  neck stretching    Consulted and Agree with Plan of Care  Patient;Family member/caregiver       Patient will benefit from skilled therapeutic intervention in order to improve the following deficits and impairments:  Decreased range of motion, Impaired perceived functional ability, Impaired UE functional use, Improper body mechanics  Visit Diagnosis: Muscle weakness  Stiffness of cervical spine  Muscle weakness (generalized)     Problem List Patient Active Problem List   Diagnosis Date Noted  . Sepsis (HCC) 08/19/2015    Myrene Galas, PT DPT 04/17/2017, 10:47 AM  [place] [facility] PHYSICAL AND SPORTS MEDICINE [address] [place], [address] Phone: [phone]   Fax:  [phone]  Name: Jacob Green MRN: [id number] Date of Birth: 22-Oct-1965

## 2017-05-01 ENCOUNTER — Ambulatory Visit: Payer: Medicare Other

## 2017-05-01 DIAGNOSIS — M6281 Muscle weakness (generalized): Secondary | ICD-10-CM | POA: Diagnosis not present

## 2017-05-01 DIAGNOSIS — M436 Torticollis: Secondary | ICD-10-CM

## 2017-05-01 NOTE — Therapy (Signed)
[place] [facility] [address], [place], [address] Phone: [phone]   Fax:  [phone]  Physical Therapy Treatment  Patient Details  Name: Jacob Green MRN: [id number] Date of Birth: May 12, 1965 No Data Recorded  Encounter Date: 05/01/2017  PT End of Session - 05/01/17 1403    Visit Number  26    Number of Visits  35    Date for PT Re-Evaluation  06/26/17    PT Start Time  0918    PT Stop Time  1000    PT Time Calculation (min)  42 min    Activity Tolerance  Patient tolerated treatment well    Behavior During Therapy  [facility] for tasks assessed/performed       Past Medical History:  Diagnosis Date  . Closed TBI (traumatic brain injury) (HCC) approximately 40 yrs ago  . Glaucoma   . Seizures (HCC)   . Urinary tract infection     History reviewed. No pertinent surgical history.  There were no vitals filed for this visit.  Subjective Assessment - 05/01/17 1352    Subjective  Patient's mother reports she's been performing stretching on the patients R UE to decrease skin breakdown and improve function on that side. Patient's mother states she performs manual therapy but states Knoxx's muscles have a tendency of increasing tone if Tylor misses PT.     Patient is accompained by:  Family member    Pertinent History  TBI, chronic dystonia.    Patient Stated Goals  Mother would like to be able to feed pt, wash pt, and shaving/brushing teeth.    Currently in Pain?  No/denies       TREATMENT:   Manual therapy:  Performed STM along left peri-spinal, upper traps, scalenes cervical extensors/rotators, levator scap, SCM and periscapular muscular with the patient in sitting as he demonstrates increased tone in the area   Performed stretching to the finger flexors, elbow flexors and overhead shoulder flexion performed 4 x in each position. Patient positioned in sitting during the entirety of the session    Therapeutic Exercise: Patient held his head erect with slight cervical lateral flexion with tactile cueing and minA for x 6 and constant tactile and verbal cueing required to maintain erect head posture; most notably with extension and R side bending.    Performed tactile cueing to patient's lateral aspect of the right side of his head, neck and right shoulder to direct patient action when holding up erect.   Performed AAROM cervical rotation in erect posture --  x 20 with therapist assistance to imrove rotational AROM and help decrease muscular spasms and pain   Patient demonstrates improvement in cervical AROM at end of session.     PT Education - 05/01/17 1403    Education provided  Yes    Education Details  form/technique with exercise    Person(s) Educated  Patient    Methods  Explanation;Demonstration;Verbal cues;Tactile cues    Comprehension  Verbalized understanding;Returned demonstration          PT Long Term Goals - 04/03/17 1323      PT LONG TERM GOAL #1   Title  Pt will demonstrate improved cervical posture as seen by decr. in line of redness in cervical region from 8" to less than 4" to decr. risk for skin breakdown    Baseline  07/04/16: 5" 08/01/16: 5"; 09/26/16: 5"; 01/02/2017: 5" (lighter in color then previous visit); 04/03/16: 4"  length continued decrease in redness    Time  12    Period  Weeks    Status  On-going      PT LONG TERM GOAL #2   Title  Pt will be able to achieve 10 deg. cervical lateral flexion to R indicating improvement in motion to decr. strain on caregivers with ADLs    Baseline  able to achieve -5 deg. to neutral; 07/04/16: 2 degrees past neutral; 08/01/16: 3 degrees past neutral 09/26/2016: 5 deg from neutral; 01/02/17: 5 deg past neutral; 04/03/17: 8 deg past neutral    Time  12    Period  Weeks    Status  On-going      PT LONG TERM GOAL #3   Title  Patient will improve elbow extension to 30 deg from neutral on the R to allow for improved  performance with caregiver to donn/doff jackets & shirts.     Baseline  70deg from neutral on the R    Time  12    Period  Weeks    Status  New            Plan - 05/01/17 1404    Clinical Impression Statement  Continued to address patient's decreased AROM/PROM of the R UE to decrease skin breakdown around the hand and improve functional capacity of the affected R side. Patient continues to demonstrate improvement with exercise performance with ability to hold his head erect longer compared to previous visist. Patient will benefit from further skilled therapy to return to prior level of function.     Rehab Potential  Fair    Clinical Impairments Affecting Rehab Potential  TBI, family support    PT Frequency  1x / week    PT Duration  12 weeks    PT Treatment/Interventions  Manual techniques;Wheelchair mobility training;Therapeutic activities;ADLs/Self Care Home Management;Functional mobility training;Therapeutic exercise    PT Next Visit Plan  Continue with cervical STM, stretching, and active cervical motion    PT Home Exercise Plan  neck stretching    Consulted and Agree with Plan of Care  Patient;Family member/caregiver       Patient will benefit from skilled therapeutic intervention in order to improve the following deficits and impairments:  Decreased range of motion, Impaired perceived functional ability, Impaired UE functional use, Improper body mechanics  Visit Diagnosis: Muscle weakness  Stiffness of cervical spine  Muscle weakness (generalized)     Problem List Patient Active Problem List   Diagnosis Date Noted  . Sepsis (HCC) 08/19/2015    Myrene GalasWesley Steele Ledonne, PT DPT 05/01/2017, 2:28 PM  [place] [facility] [address] [place], [place], [address] Phone: [phone]   Fax:  [phone]  Name: Lavina HammanSteven G Chapple MRN: [id number] Date of Birth: 08/22/1965

## 2017-05-15 ENCOUNTER — Ambulatory Visit: Payer: Medicare Other | Attending: Neurology

## 2017-05-15 DIAGNOSIS — M6281 Muscle weakness (generalized): Secondary | ICD-10-CM | POA: Insufficient documentation

## 2017-05-15 DIAGNOSIS — M436 Torticollis: Secondary | ICD-10-CM | POA: Insufficient documentation

## 2017-05-15 NOTE — Therapy (Signed)
[place]  [facility] [address] [place], [place], [address] Phone: [phone]   Fax:  [phone]  Physical Therapy Treatment  Patient Details  Name: Jacob HammanSteven G Rossman MRN: [id number] Date of Birth: 08/04/1965 No Data Recorded  Encounter Date: 05/15/2017  PT End of Session - 05/15/17 1229    Visit Number  27    Number of Visits  35    Date for PT Re-Evaluation  06/26/17    PT Start Time  0930    PT Stop Time  1000    PT Time Calculation (min)  30 min    Activity Tolerance  Patient tolerated treatment well    Behavior During Therapy  [facility] for tasks assessed/performed       Past Medical History:  Diagnosis Date  . Closed TBI (traumatic brain injury) (HCC) approximately 40 yrs ago  . Glaucoma   . Seizures (HCC)   . Urinary tract infection     History reviewed. No pertinent surgical history.  There were no vitals filed for this visit.  Subjective Assessment - 05/15/17 1221    Subjective  Patient's mother reports Jacob Green has been resting his head at an improved position compared to previous. Patient's mother reports shaving is overall improving.     Patient is accompained by:  Family member    Pertinent History  TBI, chronic dystonia.    Patient Stated Goals  Mother would like to be able to feed pt, wash pt, and shaving/brushing teeth.    Currently in Pain?  No/denies       TREATMENT:  Manual therapy:  Performed STM along left peri-spinal, upper traps, scalenes cervical extensors/rotators, levator scap, SCM and periscapular muscular with the patient in sitting as he demonstrates increased tone in the area  Performed, elbow flexors and overhead shoulder flexion performed 2x 1min in each position. Patient positioned in sitting during the entirety of the session. Shoulder flexion AROM x 10 with patient positioned in [facility]  Therapeutic Exercise: Patient held his head erect with slight cervical lateral flexion with  tactile cueing and minA for691minx 2and constant tactile and verbal cueing required to maintain erect head posture; most notably with extension and R side bending.  Performed tactile cueing to patient's lateral aspect of the right side of his head, neckand right shoulder to direct patient action when holding up erect.  Performed AAROM cervical rotation in erect posture --  4 x 10 with therapist assistance to imrove rotational AROM and help decrease muscular spasms and pain  Patient demonstrates improvement in cervical AROM at end of session.     PT Education - 05/15/17 1228    Education provided  Yes    Education Details  form/technique with exercise    Person(s) Educated  Patient    Methods  Explanation;Demonstration    Comprehension  Verbalized understanding;Returned demonstration          PT Long Term Goals - 04/03/17 1323      PT LONG TERM GOAL #1   Title  Pt will demonstrate improved cervical posture as seen by decr. in line of redness in cervical region from 8" to less than 4" to decr. risk for skin breakdown    Baseline  07/04/16: 5" 08/01/16: 5"; 09/26/16: 5"; 01/02/2017: 5" (lighter in color then previous visit); 04/03/16: 4" length continued decrease in redness    Time  12    Period  Weeks    Status  On-going  PT LONG TERM GOAL #2   Title  Pt will be able to achieve 10 deg. cervical lateral flexion to R indicating improvement in motion to decr. strain on caregivers with ADLs    Baseline  able to achieve -5 deg. to neutral; 07/04/16: 2 degrees past neutral; 08/01/16: 3 degrees past neutral 09/26/2016: 5 deg from neutral; 01/02/17: 5 deg past neutral; 04/03/17: 8 deg past neutral    Time  12    Period  Weeks    Status  On-going      PT LONG TERM GOAL #3   Title  Patient will improve elbow extension to 30 deg from neutral on the R to allow for improved performance with caregiver to donn/doff jackets & shirts.     Baseline  70deg from neutral on the R    Time  12     Period  Weeks    Status  New            Plan - 05/15/17 1229    Clinical Impression Statement  Patient continued to improve his ability to raise his head erect with minimal tactile cueing to the patient's head. Patient also demonstrates improvement with ability to maintain an erect posture with less PT support. Patient will benefit from further skilled therapy to return to prior level of function.    Rehab Potential  Fair    Clinical Impairments Affecting Rehab Potential  TBI, family support    PT Frequency  1x / week    PT Duration  12 weeks    PT Treatment/Interventions  Manual techniques;Wheelchair mobility training;Therapeutic activities;ADLs/Self Care Home Management;Functional mobility training;Therapeutic exercise    PT Next Visit Plan  Continue with cervical STM, stretching, and active cervical motion    PT Home Exercise Plan  neck stretching    Consulted and Agree with Plan of Care  Patient;Family member/caregiver       Patient will benefit from skilled therapeutic intervention in order to improve the following deficits and impairments:  Decreased range of motion, Impaired perceived functional ability, Impaired UE functional use, Improper body mechanics  Visit Diagnosis: Muscle weakness  Stiffness of cervical spine  Muscle weakness (generalized)     Problem List Patient Active Problem List   Diagnosis Date Noted  . Sepsis (HCC) 08/19/2015    Myrene Galas, PT DPT 05/15/2017, 12:40 PM  [place] [facility] PHYSICAL AND SPORTS MEDICINE [address], [place], [address] Phone: [phone]   Fax:  [phone]  Name: Jacob Green MRN: [id number] Date of Birth: 06-02-1965

## 2017-05-29 ENCOUNTER — Ambulatory Visit: Payer: Medicare Other

## 2017-05-29 DIAGNOSIS — M6281 Muscle weakness (generalized): Secondary | ICD-10-CM

## 2017-05-29 DIAGNOSIS — M436 Torticollis: Secondary | ICD-10-CM

## 2017-05-29 NOTE — Therapy (Signed)
[facility] [address], [place], [address] Phone: [phone]   Fax:  [phone]  Physical Therapy Treatment  Patient Details  Name: Jacob Green MRN: [id number] Date of Birth: Oct 06, 1965 No data recorded  Encounter Date: 05/29/2017  PT End of Session - 05/29/17 1233    Visit Number  28    Number of Visits  35    Date for PT Re-Evaluation  06/26/17    PT Start Time  0930    PT Stop Time  1000    PT Time Calculation (min)  30 min    Activity Tolerance  Patient tolerated treatment well    Behavior During Therapy  [facility] for tasks assessed/performed       Past Medical History:  Diagnosis Date  . Closed TBI (traumatic brain injury) (HCC) approximately 40 yrs ago  . Glaucoma   . Seizures (HCC)   . Urinary tract infection     History reviewed. No pertinent surgical history.  There were no vitals filed for this visit.  Subjective Assessment - 05/29/17 1050    Subjective  Patient's mother reports increased stiffness today secondary to the fluctating weather and states difficulty with bending his neck laterally.     Patient is accompained by:  Family member    Pertinent History  TBI, chronic dystonia.    Patient Stated Goals  Mother would like to be able to feed pt, wash pt, and shaving/brushing teeth.    Currently in Pain?  No/denies       TREATMENT:  Manual therapy:  Performed STM along left peri-spinal, upper traps, scalenes cervical extensors/rotators, levator scap, SCM and periscapular muscular with the patient in sitting as he demonstrates increased tone in the area    Therapeutic Exercise: Patient held his head erect with slight cervical lateral flexion with tactile cueing and minA for45minx 2and constant tactile and verbal cueing required to maintain erect head posture; most notably with extension and R side bending.  Performed tactile cueing to patient's lateral aspect of the right  side of his head, neckand right shoulder to direct patient action when holding up erect.  Performed AAROM cervical rotation in erect posture -- 2 x 10 with therapist assistance to imrove rotational AROM and help decrease muscular spasms and pain  Patient demonstrates improvement in cervical AROM at end of session.    PT Education - 05/29/17 1233    Education provided  Yes    Education Details  form/technique with exercise    Person(s) Educated  Patient    Methods  Explanation;Demonstration    Comprehension  Verbalized understanding;Returned demonstration          PT Long Term Goals - 04/03/17 1323      PT LONG TERM GOAL #1   Title  Pt will demonstrate improved cervical posture as seen by decr. in line of redness in cervical region from 8" to less than 4" to decr. risk for skin breakdown    Baseline  07/04/16: 5" 08/01/16: 5"; 09/26/16: 5"; 01/02/2017: 5" (lighter in color then previous visit); 04/03/16: 4" length continued decrease in redness    Time  12    Period  Weeks    Status  On-going      PT LONG TERM GOAL #2   Title  Pt will be able to achieve 10 deg. cervical lateral flexion to R indicating improvement in motion to decr. strain on caregivers with ADLs    Baseline  able to achieve -5 deg. to neutral; 07/04/16: 2 degrees past neutral; 08/01/16: 3 degrees past neutral 09/26/2016: 5 deg from neutral; 01/02/17: 5 deg past neutral; 04/03/17: 8 deg past neutral    Time  12    Period  Weeks    Status  On-going      PT LONG TERM GOAL #3   Title  Patient will improve elbow extension to 30 deg from neutral on the R to allow for improved performance with caregiver to donn/doff jackets & shirts.     Baseline  70deg from neutral on the R    Time  12    Period  Weeks    Status  New            Plan - 05/29/17 1234    Clinical Impression Statement  Patient arrives 15 min late for appointment therefore performed shortened session today. Patient demosntrates less cervical lateral  flexion today versus previous treatments and required a greater amount of STM to improve elasticity and perform exercises within normal range. Patient will benefit from further skilled therapy to return to prior level of function.      Rehab Potential  Fair    Clinical Impairments Affecting Rehab Potential  TBI, family support    PT Frequency  1x / week    PT Duration  12 weeks    PT Treatment/Interventions  Manual techniques;Wheelchair mobility training;Therapeutic activities;ADLs/Self Care Home Management;Functional mobility training;Therapeutic exercise    PT Next Visit Plan  Continue with cervical STM, stretching, and active cervical motion    PT Home Exercise Plan  neck stretching    Consulted and Agree with Plan of Care  Patient;Family member/caregiver       Patient will benefit from skilled therapeutic intervention in order to improve the following deficits and impairments:  Decreased range of motion, Impaired perceived functional ability, Impaired UE functional use, Improper body mechanics  Visit Diagnosis: Muscle weakness  Muscle weakness (generalized)  Stiffness of cervical spine     Problem List Patient Active Problem List   Diagnosis Date Noted  . Sepsis (HCC) 08/19/2015    Myrene GalasWesley Megon Kalina, PT DPT 05/29/2017, 12:37 PM  [place] [facility] [address] [place], [place], [address] Phone: [phone]   Fax:  [phone]  Name: Jacob Green MRN: [id number] Date of Birth: 01/19/1966

## 2017-06-12 ENCOUNTER — Ambulatory Visit: Payer: Medicare Other | Attending: Neurology

## 2017-06-12 DIAGNOSIS — M436 Torticollis: Secondary | ICD-10-CM | POA: Insufficient documentation

## 2017-06-12 DIAGNOSIS — M6281 Muscle weakness (generalized): Secondary | ICD-10-CM | POA: Diagnosis present

## 2017-06-12 NOTE — Therapy (Signed)
[facility] [address] [place], [address] Phone: [phone]   Fax:  [phone]  Physical Therapy Treatment  Patient Details  Name: Jacob Green MRN: [id number] Date of Birth: 08/17/1965 No data recorded  Encounter Date: 06/12/2017  PT End of Session - 06/12/17 1035    Visit Number  29    Number of Visits  35    Date for PT Re-Evaluation  06/26/17    PT Start Time  0920    PT Stop Time  1000    PT Time Calculation (min)  40 min    Activity Tolerance  Patient tolerated treatment well    Behavior During Therapy  [facility] for tasks assessed/performed       Past Medical History:  Diagnosis Date  . Closed TBI (traumatic brain injury) (HCC) approximately 40 yrs ago  . Glaucoma   . Seizures (HCC)   . Urinary tract infection     History reviewed. No pertinent surgical history.  There were no vitals filed for this visit.  Subjective Assessment - 06/12/17 1023    Subjective  Patient's mother reports he has been energetic this morning and reports he has been doing well with moving his neck to allow for performance of ADLs such as shaving.     Patient is accompained by:  Family member    Pertinent History  TBI, chronic dystonia.    Patient Stated Goals  Mother would like to be able to feed pt, wash pt, and shaving/brushing teeth.    Currently in Pain?  No/denies       TREATMENT:   Manual therapy:  Performed STM along left peri-spinal, upper traps, scalenes cervical extensors/rotators and periscapular muscular with the patient in sitting as he demonstrates increased tone in the area   PROM finger extension, elbow extension and shoulder flexion - x 15 in each position to improve pain and spasms with performance   Therapeutic Exercise: Patient held his head erect with slight cervical lateral flexion with tactile cueing and minA for x 6 and constant tactile and verbal cueing required to maintain erect  head posture; most notably with extension and R side bending.    Performed tactile cueing to patient's lateral aspect of the right side of his head, neck and right shoulder to direct patient action when holding up erect.   Performed AAROM cervical rotation in erect posture -- 4 x 10 with therapist assistance to improve rotational AROM and help decrease muscular spasms and pain   Patient demonstrates improvement in cervical AROM at end of session.      PT Education - 06/12/17 1034    Education provided  Yes    Education Details  form/technique with exercise    Person(s) Educated  Patient    Methods  Explanation;Demonstration    Comprehension  Verbalized understanding;Returned demonstration          PT Long Term Goals - 04/03/17 1323      PT LONG TERM GOAL #1   Title  Pt will demonstrate improved cervical posture as seen by decr. in line of redness in cervical region from 8" to less than 4" to decr. risk for skin breakdown    Baseline  07/04/16: 5" 08/01/16: 5"; 09/26/16: 5"; 01/02/2017: 5" (lighter in color then previous visit); 04/03/16: 4" length continued decrease in redness    Time  12    Period  Weeks    Status  On-going  PT LONG TERM GOAL #2   Title  Pt will be able to achieve 10 deg. cervical lateral flexion to R indicating improvement in motion to decr. strain on caregivers with ADLs    Baseline  able to achieve -5 deg. to neutral; 07/04/16: 2 degrees past neutral; 08/01/16: 3 degrees past neutral 09/26/2016: 5 deg from neutral; 01/02/17: 5 deg past neutral; 04/03/17: 8 deg past neutral    Time  12    Period  Weeks    Status  On-going      PT LONG TERM GOAL #3   Title  Patient will improve elbow extension to 30 deg from neutral on the R to allow for improved performance with caregiver to donn/doff jackets & shirts.     Baseline  70deg from neutral on the R    Time  12    Period  Weeks    Status  New            Plan - 06/12/17 1044    Clinical Impression Statement   Patient demonstrates improvement with ability to perform R lateral cervical flexion and maintaining upright posture before onset of fatigue indicating functional improvement and carryover. Although patient demonstrates improvement with cervical voluntarily motion, he has difficulty seperating the performance of cervical rotation and lateral flexion and performs both concurrently. Patient will benefit from further skilled therapy focused on improving limitations to return to prior level of function.     Rehab Potential  Fair    Clinical Impairments Affecting Rehab Potential  TBI, family support    PT Frequency  1x / week    PT Duration  12 weeks    PT Treatment/Interventions  Manual techniques;Wheelchair mobility training;Therapeutic activities;ADLs/Self Care Home Management;Functional mobility training;Therapeutic exercise    PT Next Visit Plan  Continue with cervical STM, stretching, and active cervical motion    PT Home Exercise Plan  neck stretching    Consulted and Agree with Plan of Care  Patient;Family member/caregiver       Patient will benefit from skilled therapeutic intervention in order to improve the following deficits and impairments:  Decreased range of motion, Impaired perceived functional ability, Impaired UE functional use, Improper body mechanics  Visit Diagnosis: Muscle weakness (generalized)  Stiffness of cervical spine     Problem List Patient Active Problem List   Diagnosis Date Noted  . Sepsis (HCC) 08/19/2015    Myrene GalasWesley Feleica Fulmore, PT DPT 06/12/2017, 5:08 PM  [facility] [address] [place], [place], [id number] Phone: [phone]   Fax:  [phone]  Name: Jacob Green MRN: [id number] Date of Birth: 08/18/1965

## 2017-06-26 ENCOUNTER — Ambulatory Visit: Payer: Medicare Other

## 2017-06-26 DIAGNOSIS — M6281 Muscle weakness (generalized): Secondary | ICD-10-CM | POA: Diagnosis not present

## 2017-06-26 DIAGNOSIS — M436 Torticollis: Secondary | ICD-10-CM

## 2017-06-26 NOTE — Therapy (Signed)
[facility] [address], [place], [address] Phone: [phone]   Fax:  [phone]  Physical Therapy Treatment  Patient Details  Name: Jacob Green MRN: [id number] Date of Birth: 1965-08-21 No data recorded  Encounter Date: 06/26/2017  PT End of Session - 06/26/17 1119    Visit Number  30    Number of Visits  35    Date for PT Re-Evaluation  06/26/17    PT Start Time  0920    PT Stop Time  1000    PT Time Calculation (min)  40 min    Activity Tolerance  Patient tolerated treatment well    Behavior During Therapy  [facility] for tasks assessed/performed       Past Medical History:  Diagnosis Date  . Closed TBI (traumatic brain injury) (HCC) approximately 40 yrs ago  . Glaucoma   . Seizures (HCC)   . Urinary tract infection     History reviewed. No pertinent surgical history.  There were no vitals filed for this visit.  Subjective Assessment - 06/26/17 1110    Subjective  Patient's mother reports he has been more energetic but has not have many changes since the previous visit.     Patient is accompained by:  Family member    Pertinent History  TBI, chronic dystonia.    Patient Stated Goals  Mother would like to be able to feed pt, wash pt, and shaving/brushing teeth.    Currently in Pain?  No/denies         TREATMENT:   Manual therapy:  Performed STM along left peri-spinal, upper traps, scalenes cervical extensors/rotators and periscapular muscular with the patient in sitting as he demonstrates increased tone in the area   PROM finger extension, elbow extension and shoulder flexion - x 15 in each position to improve pain and spasms with performance; x10 with maintain finger extension with 5 sec holds at end range   Therapeutic Exercise: Patient held his head erect with slight cervical lateral flexion with tactile cueing and minA for x 5 and constant tactile and verbal cueing required to  maintain erect head posture; most notably with extension and R side bending.    Performed tactile cueing to patient's lateral aspect of the right side of his head, neck and right shoulder to direct patient action when holding up erect.   Performed AAROM cervical rotation in erect posture -- 3 x 10 with therapist assistance to improve rotational AROM and help decrease muscular spasms and pain   Patient demonstrates improvement in cervical AROM at end of session.      PT Education - 06/26/17 1118    Education provided  Yes    Education Details  head positioning throughout therapy    Person(s) Educated  Patient    Methods  Explanation;Demonstration    Comprehension  Verbalized understanding;Returned demonstration          PT Long Term Goals - 04/03/17 1323      PT LONG TERM GOAL #1   Title  Pt will demonstrate improved cervical posture as seen by decr. in line of redness in cervical region from 8" to less than 4" to decr. risk for skin breakdown    Baseline  07/04/16: 5" 08/01/16: 5"; 09/26/16: 5"; 01/02/2017: 5" (lighter in color then previous visit); 04/03/16: 4" length continued decrease in redness    Time  12    Period  Weeks    Status  On-going      PT LONG TERM GOAL #2   Title  Pt will be able to achieve 10 deg. cervical lateral flexion to R indicating improvement in motion to decr. strain on caregivers with ADLs    Baseline  able to achieve -5 deg. to neutral; 07/04/16: 2 degrees past neutral; 08/01/16: 3 degrees past neutral 09/26/2016: 5 deg from neutral; 01/02/17: 5 deg past neutral; 04/03/17: 8 deg past neutral    Time  12    Period  Weeks    Status  On-going      PT LONG TERM GOAL #3   Title  Patient will improve elbow extension to 30 deg from neutral on the R to allow for improved performance with caregiver to donn/doff jackets & shirts.     Baseline  70deg from neutral on the R    Time  12    Period  Weeks    Status  New            Plan - 06/26/17 1120     Clinical Impression Statement  Patient demonstrates sustained ability to maintain proper head positioning throughout treatment session. Continued to focus on improving shoulder, hand, and elbow AROM on the R UE to decrease affects of contratures and improve functional capacity. Patient will benefit from further skilled therapy to return to prior level of function.     Rehab Potential  Fair    Clinical Impairments Affecting Rehab Potential  TBI, family support    PT Frequency  1x / week    PT Duration  12 weeks    PT Treatment/Interventions  Manual techniques;Wheelchair mobility training;Therapeutic activities;ADLs/Self Care Home Management;Functional mobility training;Therapeutic exercise    PT Next Visit Plan  Continue with cervical STM, stretching, and active cervical motion    PT Home Exercise Plan  neck stretching    Consulted and Agree with Plan of Care  Patient;Family member/caregiver       Patient will benefit from skilled therapeutic intervention in order to improve the following deficits and impairments:  Decreased range of motion, Impaired perceived functional ability, Impaired UE functional use, Improper body mechanics  Visit Diagnosis: Muscle weakness  Stiffness of cervical spine  Muscle weakness (generalized)     Problem List Patient Active Problem List   Diagnosis Date Noted  . Sepsis (HCC) 08/19/2015    Myrene GalasWesley Shadiyah Wernli ,PT DPT 06/26/2017, 11:23 AM  [facility] [address] [place], [place], [address] Phone: [phone]   Fax:  [phone]  Name: Jacob Green MRN: [id number] Date of Birth: 02/02/1966

## 2017-07-10 ENCOUNTER — Ambulatory Visit: Payer: Medicare Other | Attending: Neurology

## 2017-07-10 DIAGNOSIS — M6281 Muscle weakness (generalized): Secondary | ICD-10-CM | POA: Diagnosis not present

## 2017-07-10 DIAGNOSIS — M436 Torticollis: Secondary | ICD-10-CM | POA: Diagnosis present

## 2017-07-10 NOTE — Addendum Note (Signed)
Addended by: Bethanie Dicker on: 07/10/2017 02:12 PM   Modules accepted: Orders

## 2017-07-10 NOTE — Therapy (Signed)
[place] [facility] [address], [place], [address] Phone: [phone]   Fax:  [phone]  Physical Therapy Treatment/ Progress note   Patient Details  Name: Jacob Green MRN: [id number] Date of Birth: 1965/04/28 No data recorded  Encounter Date: 07/10/2017  PT End of Session - 07/10/17 1251    Visit Number  31    Number of Visits  35    Date for PT Re-Evaluation  10/02/17    Authorization Type  1 / 10 Progress note    PT Start Time  0920    PT Stop Time  1000    PT Time Calculation (min)  40 min    Activity Tolerance  Patient tolerated treatment well    Behavior During Therapy  [facility] for tasks assessed/performed       Past Medical History:  Diagnosis Date  . Closed TBI (traumatic brain injury) (HCC) approximately 40 yrs ago  . Glaucoma   . Seizures (HCC)   . Urinary tract infection     History reviewed. No pertinent surgical history.  There were no vitals filed for this visit.  Subjective Assessment - 07/10/17 1245    Subjective  Patient's mother reports she has been busy because she has been preparing for [employer] birthday party. Patient's mother reports her son has been more energetic since his birthday party.     Patient is accompained by:  Family member    Pertinent History  TBI, chronic dystonia.    Patient Stated Goals  Mother would like to be able to feed pt, wash pt, and shaving/brushing teeth.    Currently in Pain?  No/denies        TREATMENT:  Manual therapy:  Performed STM along left peri-spinal, upper traps, scalenes cervical extensors/rotators and periscapular muscular with the patient in sitting as he demonstrates increased tone in the area  PROM finger extension, elbow extension and shoulder flexion - x 15 in each position to improve pain and spasms with performance; x10 with maintain finger extension with 5 sec holds at end range  Therapeutic Exercise: Patient held his head erect  with slight cervical lateral flexion with tactile cueing and minA for32minx5and constant tactile and verbal cueing required to maintain erect head posture; most notably with extension and R side bending.  Performed tactile cueing to patient's lateral aspect of the right side of his head, neckand right shoulder to direct patient action when holding up erect.  Performed AAROM cervical rotation in erect posture --5x10 with therapist assistance to improve rotational AROM and help decrease muscular spasms and pain  Patient demonstrates improvement in cervical AROM at end of session.   PT Education - 07/10/17 1250    Education provided  Yes    Education Details  form/technique with exercise    Person(s) Educated  Patient    Methods  Explanation;Demonstration    Comprehension  Verbalized understanding;Returned demonstration          PT Long Term Goals - 07/10/17 1253      PT LONG TERM GOAL #1   Title  Pt will demonstrate improved cervical posture as seen by decr. in line of redness in cervical region from 8" to less than 4" to decr. risk for skin breakdown    Baseline  07/04/16: 5" 08/01/16: 5"; 09/26/16: 5"; 01/02/2017: 5" (lighter in color then previous visit); 04/03/16: 4" length continued decrease in redness; 07/10/2017: 4" length    Time  12  Period  Weeks    Status  On-going      PT LONG TERM GOAL #2   Title  Pt will be able to achieve 10 deg. cervical lateral flexion to R indicating improvement in motion to decr. strain on caregivers with ADLs    Baseline  able to achieve -5 deg. to neutral; 07/04/16: 2 degrees past neutral; 08/01/16: 3 degrees past neutral 09/26/2016: 5 deg from neutral; 01/02/17: 5 deg past neutral; 04/03/17: 8 deg past neutral; 07/10/2017: 10 deg past neutral    Time  12    Period  Weeks    Status  Achieved      PT LONG TERM GOAL #3   Title  Patient will improve elbow extension to 30 deg from neutral on the R to allow for improved performance with caregiver  to donn/doff jackets & shirts.     Baseline  70deg from neutral on the R; 07/10/2017 65 degrees from neutral     Time  12    Period  Weeks    Status  On-going              Patient will benefit from skilled therapeutic intervention in order to improve the following deficits and impairments:     Visit Diagnosis: Muscle weakness  Stiffness of cervical spine  Muscle weakness (generalized)     Problem List Patient Active Problem List   Diagnosis Date Noted  . Sepsis (HCC) 08/19/2015    Myrene Galas, PT DPT 07/10/2017, 1:00 PM  [facility] PHYSICAL AND SPORTS MEDICINE [address], [place], [address] Phone: [phone]   Fax:  [phone]  Name: Jacob Green MRN: [id number] Date of Birth: 01-20-1966

## 2017-07-24 ENCOUNTER — Ambulatory Visit: Payer: Medicare Other

## 2017-07-24 DIAGNOSIS — M6281 Muscle weakness (generalized): Secondary | ICD-10-CM | POA: Diagnosis not present

## 2017-07-24 DIAGNOSIS — M436 Torticollis: Secondary | ICD-10-CM

## 2017-07-24 NOTE — Therapy (Signed)
[place] [facility] [address] [place], [address] Phone: [phone]   Fax:  [phone]  Physical Therapy Treatment  Patient Details  Name: Jacob Green MRN: [id number] Date of Birth: Oct 10, 1965 No data recorded  Encounter Date: 07/24/2017  PT End of Session - 07/24/17 1252    Visit Number  32    Number of Visits  43    Date for PT Re-Evaluation  10/02/17    Authorization Type  2 / 10 Progress note    PT Start Time  1030    PT Stop Time  1115    PT Time Calculation (min)  45 min    Activity Tolerance  Patient tolerated treatment well    Behavior During Therapy  [facility] for tasks assessed/performed       Past Medical History:  Diagnosis Date  . Closed TBI (traumatic brain injury) (HCC) approximately 40 yrs ago  . Glaucoma   . Seizures (HCC)   . Urinary tract infection     History reviewed. No pertinent surgical history.  There were no vitals filed for this visit.  Subjective Assessment - 07/24/17 1250    Subjective  Patient's mother reports Thorsten has been sick with a UTI for 2 weeks. Patient states she would like for Zaylon to improve his R lateral cervical musculature.     Patient is accompained by:  Family member    Pertinent History  TBI, chronic dystonia.    Patient Stated Goals  Mother would like to be able to feed pt, wash pt, and shaving/brushing teeth.    Currently in Pain?  No/denies       TREATMENT:  Manual therapy:  Performed STM along left peri-spinal, upper traps, scalenes cervical extensors/rotators and periscapular muscular with the patient in sitting as he demonstrates increased tone in the area  PROM finger extension, elbow extension and shoulder flexion - x 15 in each position to improve pain and spasms with performance; x10 with maintain finger extension with 5 sec holds at end range  Therapeutic Exercise: Patient held his head erect with slight cervical lateral flexion with  tactile cueing and minA for6minx10and constant tactile and verbal cueing required to maintain erect head posture; most notably with extension and R side bending.  Performed tactile cueing to patient's lateral aspect of the right side of his head, neckand right shoulder to direct patient action when holding up erect x10  Performed AAROM cervical rotation in erect posture --3x10 with therapist assistance to improve rotational AROM and help decrease muscular spasms and pain  Patient demonstrates improvement in cervical AROM at end of session.     PT Education - 07/24/17 1252    Education provided  Yes    Education Details  form/technique with exercise    Person(s) Educated  Patient    Methods  Explanation;Demonstration    Comprehension  Verbalized understanding;Returned demonstration          PT Long Term Goals - 07/10/17 1253      PT LONG TERM GOAL #1   Title  Pt will demonstrate improved cervical posture as seen by decr. in line of redness in cervical region from 8" to less than 4" to decr. risk for skin breakdown    Baseline  07/04/16: 5" 08/01/16: 5"; 09/26/16: 5"; 01/02/2017: 5" (lighter in color then previous visit); 04/03/16: 4" length continued decrease in redness; 07/10/2017: 4" length    Time  12    Period  Weeks  Status  On-going      PT LONG TERM GOAL #2   Title  Pt will be able to achieve 10 deg. cervical lateral flexion to R indicating improvement in motion to decr. strain on caregivers with ADLs    Baseline  able to achieve -5 deg. to neutral; 07/04/16: 2 degrees past neutral; 08/01/16: 3 degrees past neutral 09/26/2016: 5 deg from neutral; 01/02/17: 5 deg past neutral; 04/03/17: 8 deg past neutral; 07/10/2017: 10 deg past neutral    Time  12    Period  Weeks    Status  Achieved      PT LONG TERM GOAL #3   Title  Patient will improve elbow extension to 30 deg from neutral on the R to allow for improved performance with caregiver to donn/doff jackets & shirts.      Baseline  70deg from neutral on the R; 07/10/2017 65 degrees from neutral     Time  12    Period  Weeks    Status  On-going            Plan - 07/24/17 1254    Clinical Impression Statement  Patient demonstrates decreased strength with performing lateral head shift to the R side and performed greater amount of cervical lateral flexion and holds in sitting. Patient demonstrates improvement with performing finger extension; however, patient demonstrates increased skin break down and maceration between the 2nd and 3rd digits on the R UE. Educated mother to monitor symptoms. Patient will benefit from further skilled therapy to return to prior level of function.     Rehab Potential  Fair    Clinical Impairments Affecting Rehab Potential  TBI, family support    PT Frequency  1x / week    PT Duration  12 weeks    PT Treatment/Interventions  Manual techniques;Wheelchair mobility training;Therapeutic activities;ADLs/Self Care Home Management;Functional mobility training;Therapeutic exercise    PT Next Visit Plan  Continue with cervical STM, stretching, and active cervical motion    PT Home Exercise Plan  neck stretching    Consulted and Agree with Plan of Care  Patient;Family member/caregiver       Patient will benefit from skilled therapeutic intervention in order to improve the following deficits and impairments:  Decreased range of motion, Impaired perceived functional ability, Impaired UE functional use, Improper body mechanics  Visit Diagnosis: Muscle weakness  Stiffness of cervical spine  Muscle weakness (generalized)     Problem List Patient Active Problem List   Diagnosis Date Noted  . Sepsis (HCC) 08/19/2015    Myrene Galas, PT DPT 07/24/2017, 12:58 PM  [place] [facility] PHYSICAL AND SPORTS MEDICINE [address], [place], [address] Phone: [phone]   Fax:  [phone]  Name: Jacob Green MRN: [id number] Date of Birth:  [birth year]

## 2017-08-14 ENCOUNTER — Ambulatory Visit: Payer: Medicare Other | Attending: Neurology

## 2017-08-14 DIAGNOSIS — M6281 Muscle weakness (generalized): Secondary | ICD-10-CM | POA: Insufficient documentation

## 2017-08-14 DIAGNOSIS — M436 Torticollis: Secondary | ICD-10-CM | POA: Diagnosis present

## 2017-08-14 NOTE — Therapy (Signed)
[place] [facility] [address] [place], [address] Phone: [phone]   Fax:  [phone]  Physical Therapy Treatment  Patient Details  Name: Jacob Green MRN: [id number] Date of Birth: [birth year] No data recorded  Encounter Date: 08/14/2017  PT End of Session - 08/14/17 1025    Visit Number  33    Number of Visits  43    Date for PT Re-Evaluation  10/02/17    Authorization Type  3 / 10 Progress note    PT Start Time  0920    PT Stop Time  0950    PT Time Calculation (min)  30 min    Activity Tolerance  Patient tolerated treatment well    Behavior During Therapy  [facility] for tasks assessed/performed       Past Medical History:  Diagnosis Date  . Closed TBI (traumatic brain injury) (HCC) approximately 40 yrs ago  . Glaucoma   . Seizures (HCC)   . Urinary tract infection     History reviewed. No pertinent surgical history.  There were no vitals filed for this visit.  Subjective Assessment - 08/14/17 1019    Subjective  Patient's mother states improvement in skin breakdown with Tycen's hand and neck on the L side. Patient's mother states his neck has been more mobile recently.     Patient is accompained by:  Family member    Pertinent History  TBI, chronic dystonia.    Patient Stated Goals  Mother would like to be able to feed pt, wash pt, and shaving/brushing teeth.    Currently in Pain?  No/denies        TREATMENT:  Manual therapy:  Performed STM along left peri-spinal, upper traps, scalenes cervical extensors/rotators and periscapular muscular with the patient in sitting as he demonstrates increased tone in the area  PROM finger extension, elbow extension and shoulder flexion -x 5 in each position to improve pain and spasms with performance; x5 with maintain finger extension with 5 sec holds at end range  Therapeutic Exercise: Patient held his head erect with slight cervical lateral flexion  with tactile cueing and minA for37minx10and constant tactile and verbal cueing required to maintain erect head posture; most notably with extension and R side bending.  Performed tactile cueing to patient's lateral aspect of the right side of his head, neckand right shoulder to direct patient action when holding up erect 3 x10  Performed AAROM cervical rotation in erect posture --3x10 with therapist assistance to improve rotational AROM and help decrease muscular spasms and pain  Patient demonstrates improvement in cervical AROM at end of session.    PT Education - 08/14/17 1024    Education provided  Yes    Education Details  form/technique with exercise    Person(s) Educated  Patient    Methods  Demonstration;Explanation    Comprehension  Verbalized understanding;Returned demonstration          PT Long Term Goals - 07/10/17 1253      PT LONG TERM GOAL #1   Title  Pt will demonstrate improved cervical posture as seen by decr. in line of redness in cervical region from 8" to less than 4" to decr. risk for skin breakdown    Baseline  07/04/16: 5" 08/01/16: 5"; 09/26/16: 5"; 01/02/2017: 5" (lighter in color then previous visit); 04/03/16: 4" length continued decrease in redness; 07/10/2017: 4" length    Time  12    Period  Weeks  Status  On-going      PT LONG TERM GOAL #2   Title  Pt will be able to achieve 10 deg. cervical lateral flexion to R indicating improvement in motion to decr. strain on caregivers with ADLs    Baseline  able to achieve -5 deg. to neutral; 07/04/16: 2 degrees past neutral; 08/01/16: 3 degrees past neutral 09/26/2016: 5 deg from neutral; 01/02/17: 5 deg past neutral; 04/03/17: 8 deg past neutral; 07/10/2017: 10 deg past neutral    Time  12    Period  Weeks    Status  Achieved      PT LONG TERM GOAL #3   Title  Patient will improve elbow extension to 30 deg from neutral on the R to allow for improved performance with caregiver to donn/doff jackets & shirts.      Baseline  70deg from neutral on the R; 07/10/2017 65 degrees from neutral     Time  12    Period  Weeks    Status  On-going            Plan - 08/14/17 1040    Clinical Impression Statement  Patient arrives 20 min late for appointment today thus performed shortened session. Patient initally demonstrates increased limitations with performing R cervical flexion compared to previous visits. Patient demonstrates improvement in AROM after performing cervical rotation in an erect cervical posture. Patient demonstrates less skin break down compared to previous visits indicating improvement in motor control. Patient will benefit from further skilled therapy to return to prior level of function.     Rehab Potential  Fair    Clinical Impairments Affecting Rehab Potential  TBI, family support    PT Frequency  1x / week    PT Duration  12 weeks    PT Treatment/Interventions  Manual techniques;Wheelchair mobility training;Therapeutic activities;ADLs/Self Care Home Management;Functional mobility training;Therapeutic exercise    PT Next Visit Plan  Continue with cervical STM, stretching, and active cervical motion    PT Home Exercise Plan  neck stretching    Consulted and Agree with Plan of Care  Patient;Family member/caregiver       Patient will benefit from skilled therapeutic intervention in order to improve the following deficits and impairments:  Decreased range of motion, Impaired perceived functional ability, Impaired UE functional use, Improper body mechanics  Visit Diagnosis: Muscle weakness  Stiffness of cervical spine  Muscle weakness (generalized)     Problem List Patient Active Problem List   Diagnosis Date Noted  . Sepsis (HCC) 08/19/2015    [name], PT DPT 08/14/2017, 10:44 AM  [place] [facility] [address] [place], [place], [address] Phone: [phone]   Fax:  [phone]  Name: Jacob HammanSteven G  Green MRN: [id number] Date of Birth: 07/03/1965

## 2017-08-21 ENCOUNTER — Ambulatory Visit: Payer: Medicare Other

## 2017-08-21 DIAGNOSIS — M6281 Muscle weakness (generalized): Secondary | ICD-10-CM

## 2017-08-21 DIAGNOSIS — M436 Torticollis: Secondary | ICD-10-CM

## 2017-08-21 NOTE — Therapy (Signed)
[place] [facility] PHYS[facility]TS MEDICINE [address] [place], [address] Phone: [phone]   Fax:  [phone]  Physical Therapy Treatment  Patient Details  Name: Jacob Green MRN: [id number] Date of Birth: 1966/02/20 No data recorded  Encounter Date: 08/21/2017  PT End of Session - 08/21/17 1038    Visit Number  34    Number of Visits  43    Date for PT Re-Evaluation  10/02/17    Authorization Type  4 / 10 Progress note    PT Start Time  1000    PT Stop Time  1045    PT Time Calculation (min)  45 min    Activity Tolerance  Patient tolerated treatment well    Behavior During Therapy  [facility] for tasks assessed/performed       Past Medical History:  Diagnosis Date  . Closed TBI (traumatic brain injury) (HCC) approximately 40 yrs ago  . Glaucoma   . Seizures (HCC)   . Urinary tract infection     History reviewed. No pertinent surgical history.  There were no vitals filed for this visit.  Subjective Assessment - 08/21/17 1030    Subjective  Patient mother reports Karen's neck has been more mobile recently and she continues to measure the level of skin break down along Kwasi's neck.     Patient is accompained by:  Family member    Pertinent History  TBI, chronic dystonia.    Patient Stated Goals  Mother would like to be able to feed pt, wash pt, and shaving/brushing teeth.    Currently in Pain?  No/denies       TREATMENT:   Manual therapy:  Performed STM along left peri-spinal, upper traps, scalenes cervical extensors/rotators, SCM  and periscapular muscular with the patient in sitting as he demonstrates increased tone in the area   PROM finger extension, elbow extension and shoulder flexion - x 5 in each position to improve pain and spasms with performance; x5 with maintain finger extension with 5 sec holds at end range   Therapeutic Exercise: Patient held his head erect with slight cervical lateral flexion with tactile  cueing and minA for x 10 and constant tactile and verbal cueing required to maintain erect head posture; most notably with extension and R side bending.     Performed AAROM cervical rotation in erect posture -- 4 x 10 with therapist assistance to improve rotational AROM and help decrease muscular spasms and pain  Tactile cueing performed to the lateral and posterior aspect of the neck to improve R upper trap and cervical muscular activation/strength - x 20    Patient demonstrates improvement in cervical AROM at end of session.    PT Education - 08/21/17 1034    Education provided  Yes    Education Details  form/technique with exercise    Person(s) Educated  Patient    Methods  Explanation;Demonstration    Comprehension  Returned demonstration;Verbalized understanding          PT Long Term Goals - 07/10/17 1253      PT LONG TERM GOAL #1   Title  Pt will demonstrate improved cervical posture as seen by decr. in line of redness in cervical region from 8" to less than 4" to decr. risk for skin breakdown    Baseline  07/04/16: 5" 08/01/16: 5"; 09/26/16: 5"; 01/02/2017: 5" (lighter in color then previous visit); 04/03/16: 4" length continued decrease in redness; 07/10/2017: 4" length  Time  12    Period  Weeks    Status  On-going      PT LONG TERM GOAL #2   Title  Pt will be able to achieve 10 deg. cervical lateral flexion to R indicating improvement in motion to decr. strain on caregivers with ADLs    Baseline  able to achieve -5 deg. to neutral; 07/04/16: 2 degrees past neutral; 08/01/16: 3 degrees past neutral 09/26/2016: 5 deg from neutral; 01/02/17: 5 deg past neutral; 04/03/17: 8 deg past neutral; 07/10/2017: 10 deg past neutral    Time  12    Period  Weeks    Status  Achieved      PT LONG TERM GOAL #3   Title  Patient will improve elbow extension to 30 deg from neutral on the R to allow for improved performance with caregiver to donn/doff jackets & shirts.     Baseline  70deg from  neutral on the R; 07/10/2017 65 degrees from neutral     Time  12    Period  Weeks    Status  On-going            Plan - 08/21/17 1107    Clinical Impression Statement  Patient demonstrates improvement with ability to perform cervical extension and lateal side bending with tactile cueing to the R side. Although patient is improving, he continues to have increased skin break down and decreased lateral side bending on the affected side. Patient will benefit from further skilled therapy to return to prior level of function.     Rehab Potential  Fair    Clinical Impairments Affecting Rehab Potential  TBI, family support    PT Frequency  1x / week    PT Duration  12 weeks    PT Treatment/Interventions  Manual techniques;Wheelchair mobility training;Therapeutic activities;ADLs/Self Care Home Management;Functional mobility training;Therapeutic exercise    PT Next Visit Plan  Continue with cervical STM, stretching, and active cervical motion    PT Home Exercise Plan  neck stretching    Consulted and Agree with Plan of Care  Patient;Family member/caregiver       Patient will benefit from skilled therapeutic intervention in order to improve the following deficits and impairments:  Decreased range of motion, Impaired perceived functional ability, Impaired UE functional use, Improper body mechanics  Visit Diagnosis: Muscle weakness  Stiffness of cervical spine  Muscle weakness (generalized)     Problem List Patient Active Problem List   Diagnosis Date Noted  . Sepsis (HCC) 08/19/2015    Myrene GalasWesley Augustus Zurawski, PT DPT 08/21/2017, 11:18 AM  [place] [facility] [address] [place], [place], [address] Phone: [phone]   Fax:  [phone]  Name: Lavina HammanSteven G Archibald MRN: [id number] Date of Birth: 12/27/1965

## 2017-09-04 ENCOUNTER — Ambulatory Visit: Payer: Medicare Other | Attending: Neurology

## 2017-09-04 DIAGNOSIS — M436 Torticollis: Secondary | ICD-10-CM | POA: Diagnosis present

## 2017-09-04 DIAGNOSIS — M6281 Muscle weakness (generalized): Secondary | ICD-10-CM | POA: Diagnosis present

## 2017-09-04 NOTE — Therapy (Signed)
[place] [facility] PHYSICAL [facility]ORTS MEDICINE [address] [place], [address] Phone: [phone]   Fax:  [phone]  Physical Therapy Treatment  Patient Details  Name: Jacob Green MRN: [id number] Date of Birth: [birth year] No data recorded  Encounter Date: 09/04/2017  PT End of Session - 09/04/17 1319    Visit Number  35    Number of Visits  43    Date for PT Re-Evaluation  10/02/17    Authorization Type  5 / 10 Progress note    PT Start Time  0930    PT Stop Time  1000    PT Time Calculation (min)  30 min    Activity Tolerance  Patient tolerated treatment well    Behavior During Therapy  [facility] for tasks assessed/performed       Past Medical History:  Diagnosis Date  . Closed TBI (traumatic brain injury) (HCC) approximately 40 yrs ago  . Glaucoma   . Seizures (HCC)   . Urinary tract infection     History reviewed. No pertinent surgical history.  There were no vitals filed for this visit.  Subjective Assessment - 09/04/17 1313    Subjective  Patient mother reports Kolten's neck has been maintained a correct posture since the previous session. Patient states she was late secondary to having to make calls.     Patient is accompained by:  Family member    Pertinent History  TBI, chronic dystonia.    Patient Stated Goals  Mother would like to be able to feed pt, wash pt, and shaving/brushing teeth.    Currently in Pain?  No/denies       TREATMENT:  Manual therapy:  Performed STM along left peri-spinal, upper traps, scalenes, cervical extensors/rotators, SCM  with the patient in sitting as he demonstrates increased tone in the area  PROM finger extension, elbow extension and shoulder flexion -x 10in each position to improve pain and spasms with performance; x10with maintain finger extension with 5 sec holds at end range  Therapeutic Exercise: Patient held his head erect with slight cervical lateral flexion with tactile cueing  and minA for26minx7and constant tactile and verbal cueing required to maintain erect head posture; most notably with extension and R side bending.  Performed AAROM cervical rotation in erect posture --2x10 with therapist assistance to improve rotational AROM and help decrease muscular spasms and pain  Tactile cueing performed to the lateral and posterior aspect of the neck to improve R upper trap and cervical muscular activation/strength - x 20   Patient demonstrates improvement in cervical AROM at end of session.    PT Education - 09/04/17 1319    Education provided  Yes    Education Details  form/technique with exercise    Person(s) Educated  Patient    Methods  Explanation;Demonstration    Comprehension  Verbalized understanding;Returned demonstration          PT Long Term Goals - 07/10/17 1253      PT LONG TERM GOAL #1   Title  Pt will demonstrate improved cervical posture as seen by decr. in line of redness in cervical region from 8" to less than 4" to decr. risk for skin breakdown    Baseline  07/04/16: 5" 08/01/16: 5"; 09/26/16: 5"; 01/02/2017: 5" (lighter in color then previous visit); 04/03/16: 4" length continued decrease in redness; 07/10/2017: 4" length    Time  12    Period  Weeks    Status  On-going  PT LONG TERM GOAL #2   Title  Pt will be able to achieve 10 deg. cervical lateral flexion to R indicating improvement in motion to decr. strain on caregivers with ADLs    Baseline  able to achieve -5 deg. to neutral; 07/04/16: 2 degrees past neutral; 08/01/16: 3 degrees past neutral 09/26/2016: 5 deg from neutral; 01/02/17: 5 deg past neutral; 04/03/17: 8 deg past neutral; 07/10/2017: 10 deg past neutral    Time  12    Period  Weeks    Status  Achieved      PT LONG TERM GOAL #3   Title  Patient will improve elbow extension to 30 deg from neutral on the R to allow for improved performance with caregiver to donn/doff jackets & shirts.     Baseline  70deg from neutral  on the R; 07/10/2017 65 degrees from neutral     Time  12    Period  Weeks    Status  On-going            Plan - 09/04/17 1319    Clinical Impression Statement  Patient demonstrates muscular spasms along his SCM and scalenes on the L side and performed STM to decrease. Patient demonstrates greater AROM and spamss after manual therapy and focused on improving cervical motions. Patient will benefit from further skilled therapy to return to prior level of function.     Rehab Potential  Fair    Clinical Impairments Affecting Rehab Potential  TBI, family support    PT Frequency  1x / week    PT Duration  12 weeks    PT Treatment/Interventions  Manual techniques;Wheelchair mobility training;Therapeutic activities;ADLs/Self Care Home Management;Functional mobility training;Therapeutic exercise    PT Next Visit Plan  Continue with cervical STM, stretching, and active cervical motion    PT Home Exercise Plan  neck stretching    Consulted and Agree with Plan of Care  Patient;Family member/caregiver       Patient will benefit from skilled therapeutic intervention in order to improve the following deficits and impairments:  Decreased range of motion, Impaired perceived functional ability, Impaired UE functional use, Improper body mechanics  Visit Diagnosis: Muscle weakness  Stiffness of cervical spine  Muscle weakness (generalized)     Problem List Patient Active Problem List   Diagnosis Date Noted  . Sepsis (HCC) 08/19/2015    Myrene GalasWesley Grey Schlauch, PT DPT 09/04/2017, 1:27 PM  [place] [facility] [address] [place], [place], [address] Phone: [phone]   Fax:  [phone]  Name: Lavina HammanSteven G Villafranca MRN: [id number] Date of Birth: 08/06/1965

## 2017-09-18 ENCOUNTER — Ambulatory Visit: Payer: Medicare Other

## 2017-09-18 DIAGNOSIS — M6281 Muscle weakness (generalized): Secondary | ICD-10-CM | POA: Diagnosis not present

## 2017-09-18 DIAGNOSIS — M436 Torticollis: Secondary | ICD-10-CM

## 2017-09-18 NOTE — Therapy (Signed)
[facility] PHYSICAL AND [facility]ORTS MEDICINE [address], [place], [address] Phone: [phone]   Fax:  [phone]  Physical Therapy Treatment  Patient Details  Name: Jacob Green MRN: [id number] Date of Birth: March 18, 1965 No data recorded  Encounter Date: 09/18/2017  PT End of Session - 09/18/17 1354    Visit Number  36    Number of Visits  43    Date for PT Re-Evaluation  10/02/17    Authorization Type  6 / 10 Progress note    PT Start Time  0925    PT Stop Time  1005    PT Time Calculation (min)  40 min    Activity Tolerance  Patient tolerated treatment well    Behavior During Therapy  [facility] for tasks assessed/performed       Past Medical History:  Diagnosis Date  . Closed TBI (traumatic brain injury) (HCC) approximately 40 yrs ago  . Glaucoma   . Seizures (HCC)   . Urinary tract infection     History reviewed. No pertinent surgical history.  There were no vitals filed for this visit.  Subjective Assessment - 09/18/17 1350    Subjective  Patient's mother reports increased stiffness along the patient's neck. Patient states his posture has been "glued to his shoulder".     Patient is accompained by:  Family member    Pertinent History  TBI, chronic dystonia.    Patient Stated Goals  Mother would like to be able to feed pt, wash pt, and shaving/brushing teeth.    Currently in Pain?  No/denies          TREATMENT:  Manual therapy:  Performed STM along left peri-spinal, upper traps, scalenes, cervical extensors/rotators, SCM  with the patient in sitting as he demonstrates increased tone in the area  PROM finger extension, elbow extension and shoulder flexion -x 5 in each position to improve pain and spasms with performance; x10with maintain finger extension with 10 sec holds at end range  Educated on skin break down and increased maceration along the R palmar surface pf the 3rd digit.   Therapeutic Exercise: Patient  held his head erect with slight cervical lateral flexion with tactile cueing and minA for35minx7and constant tactile and verbal cueing required to maintain erect head posture; most notably with extension and R side bending.  Performed AAROM cervical rotation in erect posture --3x10 with therapist assistance to improve rotational AROM and help decrease muscular spasms and pain  Tactile cueing performed to the lateral and posterior aspect of the neck to improve R upper trap and cervical muscular activation/strength - x 20   Patient demonstrates improvement in cervical AROM at end of session.     PT Education - 09/18/17 1353    Education provided  Yes    Education Details  form/technique with exercise    Person(s) Educated  Patient    Methods  Explanation;Demonstration    Comprehension  Returned demonstration;Verbalized understanding          PT Long Term Goals - 07/10/17 1253      PT LONG TERM GOAL #1   Title  Pt will demonstrate improved cervical posture as seen by decr. in line of redness in cervical region from 8" to less than 4" to decr. risk for skin breakdown    Baseline  07/04/16: 5" 08/01/16: 5"; 09/26/16: 5"; 01/02/2017: 5" (lighter in color then previous visit); 04/03/16: 4" length continued decrease in redness; 07/10/2017: 4" length  Time  12    Period  Weeks    Status  On-going      PT LONG TERM GOAL #2   Title  Pt will be able to achieve 10 deg. cervical lateral flexion to R indicating improvement in motion to decr. strain on caregivers with ADLs    Baseline  able to achieve -5 deg. to neutral; 07/04/16: 2 degrees past neutral; 08/01/16: 3 degrees past neutral 09/26/2016: 5 deg from neutral; 01/02/17: 5 deg past neutral; 04/03/17: 8 deg past neutral; 07/10/2017: 10 deg past neutral    Time  12    Period  Weeks    Status  Achieved      PT LONG TERM GOAL #3   Title  Patient will improve elbow extension to 30 deg from neutral on the R to allow for improved performance  with caregiver to donn/doff jackets & shirts.     Baseline  70deg from neutral on the R; 07/10/2017 65 degrees from neutral     Time  12    Period  Weeks    Status  On-going            Plan - 09/18/17 1355    Clinical Impression Statement  Patient demonstrates improvement in cervical side bending at the end of the session compared to the start of the session indicating improvement with muscle elasticity. Patient demonstrates increased difficulty with rotational exercises and R side bending. Patient will benefit from further skilled therapy to return to prior level of function.     Rehab Potential  Fair    Clinical Impairments Affecting Rehab Potential  TBI, family support    PT Frequency  1x / week    PT Duration  12 weeks    PT Treatment/Interventions  Manual techniques;Wheelchair mobility training;Therapeutic activities;ADLs/Self Care Home Management;Functional mobility training;Therapeutic exercise    PT Next Visit Plan  Continue with cervical STM, stretching, and active cervical motion    PT Home Exercise Plan  neck stretching    Consulted and Agree with Plan of Care  Patient;Family member/caregiver       Patient will benefit from skilled therapeutic intervention in order to improve the following deficits and impairments:  Decreased range of motion, Impaired perceived functional ability, Impaired UE functional use, Improper body mechanics  Visit Diagnosis: Stiffness of cervical spine  Muscle weakness (generalized)     Problem List Patient Active Problem List   Diagnosis Date Noted  . Sepsis (HCC) 08/19/2015    Myrene GalasWesley Jlyn Cerros, PT DPT 09/18/2017, 2:01 PM  [facility] [address] [place], [place], [address] Phone: [phone]   Fax:  [phone]  Name: Jacob Green MRN: [id number] Date of Birth: 03/28/1965

## 2017-10-03 ENCOUNTER — Ambulatory Visit: Payer: Medicare Other | Attending: Neurology

## 2017-10-03 DIAGNOSIS — M436 Torticollis: Secondary | ICD-10-CM | POA: Insufficient documentation

## 2017-10-03 DIAGNOSIS — M6281 Muscle weakness (generalized): Secondary | ICD-10-CM | POA: Insufficient documentation

## 2017-10-09 ENCOUNTER — Ambulatory Visit: Payer: Medicare Other

## 2017-10-16 ENCOUNTER — Ambulatory Visit: Payer: Medicare Other

## 2017-10-16 DIAGNOSIS — M436 Torticollis: Secondary | ICD-10-CM | POA: Diagnosis present

## 2017-10-16 DIAGNOSIS — M6281 Muscle weakness (generalized): Secondary | ICD-10-CM | POA: Diagnosis present

## 2017-10-16 NOTE — Therapy (Signed)
[place] [facility] AND SP[facility]TS MEDICINE [address] [place], [address] Phone: [phone]   Fax:  [phone]  Physical Therapy Treatment/ Reassessment   Patient Details  Name: Jacob Green MRN: [id number] Date of Birth: 02/25/1966 Referring Provider: Melrose Nakayama    Encounter Date: 10/16/2017  PT End of Session - 10/16/17 1015    Visit Number  37    Number of Visits  43    Date for PT Re-Evaluation  10/02/17    Authorization Type  7 / 10 Progress note    Authorization Time Period  4       Past Medical History:  Diagnosis Date  . Closed TBI (traumatic brain injury) ([place]) approximately 40 yrs ago  . Glaucoma   . Seizures ([place])   . Urinary tract infection     No past surgical history on file.  There were no vitals filed for this visit.  Subjective Assessment - 10/16/17 0927    Subjective  Pt doing well today. Recent educational interventions have been well received. No medication educations. Mother reports help with stretching continue to go well.   (Pended)     Patient is accompained by:  Family member  (Pended)     Pertinent History  TBI, chronic dystonia  (Pended)     Patient Stated Goals  Mother would like to be able to feed pt, wash pt, and shaving/brushing teeth.  (Pended)          [facility] PT Assessment - 10/16/17 0001      Assessment   Medical Diagnosis  Cercival Dystonia     Referring Provider  [place]     Onset Date/Surgical Date  --   >40years ago   Hand Dominance  Left   Left prior to TBI, still left   Prior Therapy  yes      Tone   Assessment Location  Right Upper Extremity      PROM   PROM Assessment Site  Cervical    Cervical - Right Side Bend  --   -15 degrees   Cervical - Left Side Bend  --   Pt favors endrange SB to Lt, ear touching AC joint   Cervical - Right Rotation  78    Cervical - Left Rotation  64      Flexibility   Soft Tissue Assessment /Muscle Length  yes      RUE Tone   RUE Tone   Modified Ashworth      RUE Tone   Modified Ashworth Scale for Grading Hypertonia RUE  Considerable increase in muschle tone, passive movement difficult   Ashworth 3 thorughout RUE c all joints in contracture       Interventions this date: 1. Myofacial release and soft tissue mobiilization to Left cervical musculature (deep and superfiscial), Right wrist flexors, extensors. 2. P/ROM and AA/ROM in Left cervical rotation, right cervical Lateral flexion, Right short arc shoulder flexion (with scapular assist), right elbow pronation, right wrist extension and flexion, left gross digital extension, Right shoulder external/internal rotation, right trunk lateral flexion.  3. Right wrist and GHJ longitudinal joint distraction.  4. Caregiver educational interventions reviewed from prior sessions for patient mobility, positions, and care giving strategies.     PT Education - 10/16/17 1015    Education provided  No          PT Long Term Goals - 10/16/17 1033      PT LONG TERM GOAL #1   Title  Pt will demonstrate improved cervical posture as seen by decr. in line of redness in cervical region from 8" to less than 4" to decr. risk for skin breakdown    Baseline  07/04/16: 5" 08/01/16: 5"; 09/26/16: 5"; 01/02/2017: 5" (lighter in color then previous visit); 04/03/16: 4" length continued decrease in redness; 07/10/2017: 4" length; on 8/14 skin fold breakdown wounds <3.5 inches x2     Time  12    Period  Weeks    Status  On-going      PT LONG TERM GOAL #2   Title  Pt will be able to achieve 10 deg. cervical lateral flexion to R indicating improvement in motion to decr. strain on caregivers with ADLs    Baseline  able to achieve -5 deg. to neutral; 07/04/16: 2 degrees past neutral; 08/01/16: 3 degrees past neutral 09/26/2016: 5 deg from neutral; 01/02/17: 5 deg past neutral; 04/03/17: 8 deg past neutral; 07/10/2017: 10 deg past neutral   largely unchanges since prior reassesment.    Time  12    Period  Weeks     Status  Achieved      PT LONG TERM GOAL #3   Title  Patient will improve elbow extension to 30 deg from neutral on the R to allow for improved performance with caregiver to donn/doff jackets & shirts.     Baseline  8/14: able to get to 51 degrees extension; 70deg from neutral on the R; 07/10/2017 65 degrees from neutral     Time  12    Period  Weeks    Status  On-going            Plan - 10/16/17 1017    Clinical Impression Statement  Continued to with current program from prior sessions based on success of approach and positive feedback from mother/caregivers. Extensive soft tissue mobilization, longitudinal stretching, AA/ROM, and MET performed with the neck, scapula, shoulder, elbow, wrist and hand. PT checking in with patient frequently to assess patiient tolerance to techniques and maintain within a tolerable range. Pt uses a variety hand genstures and vocalization to respond to mostly yes/no format questions, mother occasionally interjecting to explain communication tendencies. Skin in palmar creases of hand is WNL this session, somewhat moist. Skin folds on the posterolateral neck at twolevels continue to demonstrate some redness, early stage break down, about 2-3 inches in length each. Pt reassessed this session for new certification, however more extensive discussion with need to take place with patient and caregiver to determine desire to continue PT services.     History and Personal Factors relevant to plan of care:  TBI 40+ years ago.     Clinical Presentation  Stable    Clinical Presentation due to:  objective tests and measures    Clinical Decision Making  High    Rehab Potential  Fair    Clinical Impairments Affecting Rehab Potential  TBI, family support    PT Frequency  1x / week    PT Duration  12 weeks    PT Treatment/Interventions  Manual techniques;Wheelchair mobility training;Therapeutic activities;ADLs/Self Care Home Management;Functional mobility training;Therapeutic  exercise    PT Next Visit Plan  Discuss new certification period and scheduling with pt/caregiver; continue with cervical STM, stretching, and active cervical motion    PT Home Exercise Plan  neck stretching    Consulted and Agree with Plan of Care  Patient;Family member/caregiver       Patient will benefit from skilled therapeutic intervention in order to  improve the following deficits and impairments:  Decreased range of motion, Impaired perceived functional ability, Impaired UE functional use, Improper body mechanics  Visit Diagnosis: Stiffness of cervical spine - Plan: PT plan of care cert/re-cert  Muscle weakness (generalized) - Plan: PT plan of care cert/re-cert  Muscle weakness - Plan: PT plan of care cert/re-cert     Problem List Patient Active Problem List   Diagnosis Date Noted  . Sepsis ([place]) 08/19/2015   10:49 AM, 10/16/17 Etta Grandchild, PT, DPT Physical Therapist - [place] [phone] (Office)    Buccola,Allan C 10/16/2017, 10:43 AM  [place] PHYSICAL AND SPORTS MEDICINE [address], [place], [address] Phone: [phone]   Fax:  [phone]  Name: Jacob Green MRN: [id number] Date of Birth: 02-25-1966

## 2017-10-30 ENCOUNTER — Ambulatory Visit: Payer: Medicare Other

## 2017-10-30 DIAGNOSIS — M436 Torticollis: Secondary | ICD-10-CM | POA: Diagnosis not present

## 2017-10-30 DIAGNOSIS — M6281 Muscle weakness (generalized): Secondary | ICD-10-CM

## 2017-10-30 NOTE — Therapy (Signed)
[facility] [address] [place], [place], [address] Phone: [phone]   Fax:  [phone]  Physical Therapy Treatment  Patient Details  Name: Jacob Green MRN: [id number] Date of Birth: 01/04/1966 Referring Provider: Malvin JohnsPotter    Encounter Date: 10/30/2017  PT End of Session - 10/30/17 1242    Visit Number  38    Number of Visits  43    Date for PT Re-Evaluation  12/16/17    Authorization Type  1 / 10 Progress note    PT Start Time  0925    PT Stop Time  1000    PT Time Calculation (min)  35 min    Activity Tolerance  Patient tolerated treatment well    Behavior During Therapy  [facility] for tasks assessed/performed       Past Medical History:  Diagnosis Date  . Closed TBI (traumatic brain injury) (HCC) approximately 40 yrs ago  . Glaucoma   . Seizures (HCC)   . Urinary tract infection     History reviewed. No pertinent surgical history.  There were no vitals filed for this visit.  Subjective Assessment - 10/30/17 1236    Subjective  Patient's mother reports the redness along the lateral aspect of Burnard's neck. Patient's mother states they are moving to [place] soon.    Patient is accompained by:  Family member    Pertinent History  TBI, chronic dystonia    Patient Stated Goals  Mother would like to be able to feed pt, wash pt, and shaving/brushing teeth.    Currently in Pain?  No/denies        TREATMENT:  Manual therapy:  Performed STM along left peri-spinal, upper traps, scalenes,cervical extensors/rotators, SCM with the patient in sitting as he demonstrates increased tone in the area  Patient held his head erect with slight cervical lateral flexion with tactile cueing and minA for571minx7and constant tactile and verbal cueing required to maintain erect head posture; most notably with extension and R side bending.  Performed PROM cervical rotation in erect posture --3x10  to improve  rotational AROM and help decrease muscular spasms and pain  Tactile cueing performed to the lateral and posterior aspect of the neck to improve R upper trap and cervical muscular activation/strength - x 20   Patient demonstrates improvement in cervical AROM at end of session.   PT Education - 10/30/17 1239    Education provided  Yes    Education Details  educated patient's mother on positioning    Person(s) Educated  Parent(s)    Methods  Explanation;Demonstration    Comprehension  Verbalized understanding;Returned demonstration          PT Long Term Goals - 10/16/17 1033      PT LONG TERM GOAL #1   Title  Pt will demonstrate improved cervical posture as seen by decr. in line of redness in cervical region from 8" to less than 4" to decr. risk for skin breakdown    Baseline  07/04/16: 5" 08/01/16: 5"; 09/26/16: 5"; 01/02/2017: 5" (lighter in color then previous visit); 04/03/16: 4" length continued decrease in redness; 07/10/2017: 4" length; on 8/14 skin fold breakdown wounds <3.5 inches x2     Time  12    Period  Weeks    Status  On-going      PT LONG TERM GOAL #2   Title  Pt will be able to achieve 10 deg. cervical lateral flexion to R indicating improvement in  motion to decr. strain on caregivers with ADLs    Baseline  able to achieve -5 deg. to neutral; 07/04/16: 2 degrees past neutral; 08/01/16: 3 degrees past neutral 09/26/2016: 5 deg from neutral; 01/02/17: 5 deg past neutral; 04/03/17: 8 deg past neutral; 07/10/2017: 10 deg past neutral   largely unchanges since prior reassesment.    Time  12    Period  Weeks    Status  Achieved      PT LONG TERM GOAL #3   Title  Patient will improve elbow extension to 30 deg from neutral on the R to allow for improved performance with caregiver to donn/doff jackets & shirts.     Baseline  8/14: able to get to 51 degrees extension; 70deg from neutral on the R; 07/10/2017 65 degrees from neutral     Time  12    Period  Weeks    Status  On-going             Plan - 10/30/17 1243    Clinical Impression Statement  Patient demonstrates increased reddness along the L lateral aspect of his neck; however patient's cervical motion is improved compared to previous sessions indicating functional carryover between visitation sessions. Patient demonstrates improvement with cervical rotation with PROM with therapist. Patient will benefit from further skilled therapy to return to prior level of function.     Rehab Potential  Fair    Clinical Impairments Affecting Rehab Potential  TBI, family support    PT Frequency  1x / week    PT Duration  12 weeks    PT Treatment/Interventions  Manual techniques;Wheelchair mobility training;Therapeutic activities;ADLs/Self Care Home Management;Functional mobility training;Therapeutic exercise    PT Next Visit Plan  Discuss new certification period and scheduling with pt/caregiver; continue with cervical STM, stretching, and active cervical motion    PT Home Exercise Plan  neck stretching    Consulted and Agree with Plan of Care  Patient;Family member/caregiver       Patient will benefit from skilled therapeutic intervention in order to improve the following deficits and impairments:  Decreased range of motion, Impaired perceived functional ability, Impaired UE functional use, Improper body mechanics  Visit Diagnosis: Stiffness of cervical spine  Muscle weakness (generalized)     Problem List Patient Active Problem List   Diagnosis Date Noted  . Sepsis (HCC) 08/19/2015    Myrene Galas, PT DPT 10/30/2017, 12:49 PM  [place] [facility] PHYSICAL AND SPORTS MEDICINE [address], [place], [address] Phone: [phone]   Fax:  [phone]  Name: Jacob Green MRN: [id number] Date of Birth: 1965/04/28

## 2017-11-13 ENCOUNTER — Ambulatory Visit: Payer: Medicare Other | Attending: Neurology

## 2017-11-13 DIAGNOSIS — M436 Torticollis: Secondary | ICD-10-CM | POA: Diagnosis not present

## 2017-11-13 DIAGNOSIS — M6281 Muscle weakness (generalized): Secondary | ICD-10-CM | POA: Diagnosis present

## 2017-11-14 NOTE — Therapy (Signed)
[facility] PHYSICAL AND SPORTS MEDICINE [address] [place], [place], [address] Phone: [phone]   Fax:  [phone]  Physical Therapy Treatment  Patient Details  Name: Jacob Green MRN: [id number] Date of Birth: 04/14/1965 Referring Provider: Malvin JohnsPotter    Encounter Date: 11/13/2017  PT End of Session - 11/13/17 1747    Visit Number  39    Number of Visits  43    Date for PT Re-Evaluation  12/16/17    Authorization Type  2 / 10 Progress note    PT Start Time  0925    PT Stop Time  0950    PT Time Calculation (min)  25 min    Activity Tolerance  Patient tolerated treatment well    Behavior During Therapy  [facility]/per[facility]       Past Medical History:  Diagnosis Date  . Closed TBI (traumatic brain injury) (HCC) approximately 40 yrs ago  . Glaucoma   . Seizures (HCC)   . Urinary tract infection     History reviewed. No pertinent surgical history.  There were no vitals filed for this visit.  Subjective Assessment - 11/13/17 1259    Subjective  Patient's mother reports Jacob Green has been able to maintain lateral side bending to a greater degree between the previous visit. Patient reports they continue to plan to move at the end of the month.     Patient is accompained by:  Family member    Pertinent History  TBI, chronic dystonia    Patient Stated Goals  Mother would like to be able to feed pt, wash pt, and shaving/brushing teeth.    Currently in Pain?  No/denies       TREATMENT:   Manual therapy:  Performed STM along left peri-spinal, upper traps, cervical extensors/rotators  with the patient in sitting as he demonstrates increased tone in the area   Patient held his head erect with slight cervical lateral flexion with tactile cueing and minA for 1min x 7 and constant tactile and verbal cueing required to maintain erect head posture; most notably with extension and R side bending.    Performed PROM cervical rotation  in erect posture -- 2 x 10  to improve rotational AROM and help decrease muscular spasms and pain   Tactile cueing performed to the lateral and posterior aspect of the neck to improve R upper trap and cervical muscular activation/strength - x 10    Patient demonstrates improvement in cervical AROM at end of session.    PT Education - 11/13/17 1301    Education provided  Yes    Education Details  educated on improving side bending to allow for less skin breakdown    Person(s) Educated  Patient    Methods  Explanation;Demonstration    Comprehension  Verbalized understanding;Returned demonstration          PT Long Term Goals - 10/16/17 1033      PT LONG TERM GOAL #1   Title  Pt will demonstrate improved cervical posture as seen by decr. in line of redness in cervical region from 8" to less than 4" to decr. risk for skin breakdown    Baseline  07/04/16: 5" 08/01/16: 5"; 09/26/16: 5"; 01/02/2017: 5" (lighter in color then previous visit); 04/03/16: 4" length continued decrease in redness; 07/10/2017: 4" length; on 8/14 skin fold breakdown wounds <3.5 inches x2     Time  12    Period  Weeks  Status  On-going      PT LONG TERM GOAL #2   Title  Pt will be able to achieve 10 deg. cervical lateral flexion to R indicating improvement in motion to decr. strain on caregivers with ADLs    Baseline  able to achieve -5 deg. to neutral; 07/04/16: 2 degrees past neutral; 08/01/16: 3 degrees past neutral 09/26/2016: 5 deg from neutral; 01/02/17: 5 deg past neutral; 04/03/17: 8 deg past neutral; 07/10/2017: 10 deg past neutral   largely unchanges since prior reassesment.    Time  12    Period  Weeks    Status  Achieved      PT LONG TERM GOAL #3   Title  Patient will improve elbow extension to 30 deg from neutral on the R to allow for improved performance with caregiver to donn/doff jackets & shirts.     Baseline  8/14: able to get to 51 degrees extension; 70deg from neutral on the R; 07/10/2017 65 degrees from  neutral     Time  12    Period  Weeks    Status  On-going            Plan - 11/13/17 1805    Clinical Impression Statement  Patient demonstrates improvement with ability to raise head to neutral with minA from PT indicating functional carryover between sessions. Although patient demosntrate simprovement, he continues to have increased tightness and decreased ability to activation his contralateral UT to allow for the side bending of the head to the R side. Shorted session performed today secondary to patient arriving late for appointment. Patient will benefit from further skilled therapy to return to prior level of function.     Rehab Potential  Fair    Clinical Impairments Affecting Rehab Potential  TBI, family support    PT Frequency  1x / week    PT Duration  12 weeks    PT Treatment/Interventions  Manual techniques;Wheelchair mobility training;Therapeutic activities;ADLs/Self Care Home Management;Functional mobility training;Therapeutic exercise    PT Next Visit Plan  Discuss new certification period and scheduling with pt/caregiver; continue with cervical STM, stretching, and active cervical motion    PT Home Exercise Plan  neck stretching    Consulted and Agree with Plan of Care  Patient;Family member/caregiver       Patient will benefit from skilled therapeutic intervention in order to improve the following deficits and impairments:  Decreased range of motion, Impaired perceived functional ability, Impaired UE functional use, Improper body mechanics  Visit Diagnosis: Stiffness of cervical spine  Muscle weakness (generalized)     Problem List Patient Active Problem List   Diagnosis Date Noted  . Sepsis (HCC) 08/19/2015    Myrene Galas, PT DPT 11/14/2017, 9:21 AM  [facility] PHYSICAL AND SPORTS MEDICINE [address] [place], [address] Phone: [phone]   Fax:  [phone]  Name: Jacob Green MRN: [id number] Date  of Birth: 1965/11/28

## 2017-11-27 ENCOUNTER — Ambulatory Visit: Payer: Medicare Other

## 2017-11-27 DIAGNOSIS — M436 Torticollis: Secondary | ICD-10-CM | POA: Diagnosis not present

## 2017-11-27 DIAGNOSIS — M6281 Muscle weakness (generalized): Secondary | ICD-10-CM

## 2017-11-27 NOTE — Therapy (Signed)
[facility] [address] [place], [address] Phone: [phone]   Fax:  [phone]  Physical Therapy Treatment  Patient Details  Name: Jacob Green MRN: [id number] Date of Birth: [birth year] Referring Provider: Malvin Johns    Encounter Date: 11/27/2017  PT End of Session - 11/27/17 1747    Visit Number  40    Number of Visits  43    Date for PT Re-Evaluation  12/16/17    Authorization Type  3 / 10 Progress note    PT Start Time  0920    PT Stop Time  1000    PT Time Calculation (min)  40 min    Activity Tolerance  Patient tolerated treatment well    Behavior During Therapy  [facility] for tasks assessed/performed       Past Medical History:  Diagnosis Date  . Closed TBI (traumatic brain injury) (HCC) approximately 40 yrs ago  . Glaucoma   . Seizures (HCC)   . Urinary tract infection     History reviewed. No pertinent surgical history.  There were no vitals filed for this visit.  Subjective Assessment - 11/27/17 1741    Subjective  Patient's mother reprots this will be their last session secondary to the fact they are moving to [place] in the upcoming weeks. Patient's mother reports increased ease with performing ADLs with [name] secondary to having physical therapy.     Patient is accompained by:  Family member    Pertinent History  TBI, chronic dystonia    Patient Stated Goals  Mother would like to be able to feed pt, wash pt, and shaving/brushing teeth.    Currently in Pain?  No/denies         TREATMENT:  Manual therapy:  Performed STM along left peri-spinal, upper traps, cervical extensors/rotators with the patient in sitting as he demonstrates increased tone in the area  PerformedPROM cervical rotation in erect posture --2x10 to improve rotational AROM and help decrease muscular spasms and pain  Tactile cueing performed to the lateral and posterior aspect of the neck to improve R upper  trap and cervical muscular activation/strength - x 10   PROM finger extension, elbow extension and shoulder flexion -x5 in each position to improve pain and spasms with performance; x10with maintain finger extension with 10 sec holds at end range  Therapeutic Exercise:  Patient held his head erect with slight cervical lateral flexion with tactile cueing and minA for38minx2and constant tactile and verbal cueing required to maintain erect head posture; most notably with extension and R side bending.  Performed AAROM cervical rotation in erect posture --3x10 with therapist assistance to improve rotational AROM and help decrease muscular spasms and pain  Tactile cueing performed to the lateral and posterior aspect of the neck to improve R upper trap and cervical muscular activation/strength - x 20   Patient demonstrates improvement in cervical AROM at end of session.    PT Education - 11/27/17 1743    Education provided  Yes    Education Details  UE positioning at rest    Person(s) Educated  Patient    Methods  Explanation;Demonstration    Comprehension  Verbalized understanding;Returned demonstration          PT Long Term Goals - 10/16/17 1033      PT LONG TERM GOAL #1   Title  Pt will demonstrate improved cervical posture as seen by decr. in line of redness in cervical region from  8" to less than 4" to decr. risk for skin breakdown    Baseline  07/04/16: 5" 08/01/16: 5"; 09/26/16: 5"; 01/02/2017: 5" (lighter in color then previous visit); 04/03/16: 4" length continued decrease in redness; 07/10/2017: 4" length; on 8/14 skin fold breakdown wounds <3.5 inches x2     Time  12    Period  Weeks    Status  On-going      PT LONG TERM GOAL #2   Title  Pt will be able to achieve 10 deg. cervical lateral flexion to R indicating improvement in motion to decr. strain on caregivers with ADLs    Baseline  able to achieve -5 deg. to neutral; 07/04/16: 2 degrees past neutral; 08/01/16: 3  degrees past neutral 09/26/2016: 5 deg from neutral; 01/02/17: 5 deg past neutral; 04/03/17: 8 deg past neutral; 07/10/2017: 10 deg past neutral   largely unchanges since prior reassesment.    Time  12    Period  Weeks    Status  Achieved      PT LONG TERM GOAL #3   Title  Patient will improve elbow extension to 30 deg from neutral on the R to allow for improved performance with caregiver to donn/doff jackets & shirts.     Baseline  8/14: able to get to 51 degrees extension; 70deg from neutral on the R; 07/10/2017 65 degrees from neutral     Time  12    Period  Weeks    Status  On-going            Plan - 11/27/17 1749    Clinical Impression Statement  Patient continues to demonstrate improvement with maintaining neutral spine with ~ 5% of assitance from the physical therapist. Although patient is improving, he continues to demonstrate increased spasticity and tightness along his left UT and long term positioning with his head to his side. Patient continues to demosntrate increased maceration along the base of the middle finger and will benefit from further skilled therapy to return to prior level of function.     Rehab Potential  Fair    Clinical Impairments Affecting Rehab Potential  TBI, family support    PT Frequency  1x / week    PT Duration  12 weeks    PT Treatment/Interventions  Manual techniques;Wheelchair mobility training;Therapeutic activities;ADLs/Self Care Home Management;Functional mobility training;Therapeutic exercise    PT Next Visit Plan  Discuss new certification period and scheduling with pt/caregiver; continue with cervical STM, stretching, and active cervical motion    PT Home Exercise Plan  neck stretching    Consulted and Agree with Plan of Care  Patient;Family member/caregiver       Patient will benefit from skilled therapeutic intervention in order to improve the following deficits and impairments:  Decreased range of motion, Impaired perceived functional ability,  Impaired UE functional use, Improper body mechanics  Visit Diagnosis: Stiffness of cervical spine  Muscle weakness (generalized)  Muscle weakness     Problem List Patient Active Problem List   Diagnosis Date Noted  . Sepsis (HCC) 08/19/2015    Myrene GalasWesley Orey Moure, PT DPT 11/27/2017, 5:54 PM  [place] [facility] [address] [place], [place], [address] Phone: [phone]   Fax:  [phone]  Name: Jacob Green MRN: [id number] Date of Birth: 02/02/1966
